# Patient Record
Sex: Female | Born: 1990 | Race: White | Hispanic: No
Health system: Midwestern US, Community
[De-identification: ages and names within clinical notes are randomized; demographics above are authoritative.]

## PROBLEM LIST (undated history)

## (undated) DIAGNOSIS — R52 Pain, unspecified: Secondary | ICD-10-CM

## (undated) DIAGNOSIS — G8918 Other acute postprocedural pain: Secondary | ICD-10-CM

## (undated) DIAGNOSIS — R110 Nausea: Secondary | ICD-10-CM

---

## 2019-05-16 IMAGING — CT CT abd/pelvis STONE PR
2 of 3 series · 14 of 42 positions shown, 18 images · non-contrast
Comparison: None

Procedure(s): CT abd/pelvis STONE PROTOCOL
PROCEDURE:

CT ABDOMEN AND PELVIS WO CONTRAST
HISTORY: Left upper quadrant abdominal pain
TECHNIQUE: CT abdomen and pelvis was performed without intravenous contrast. Axial, coronal, and sagittal
reformatted images generated.
Preliminary report was issued by [HOSPITAL].

[Series 200: ax · axial · 0.70mm/px · z∈[-404,+31]mm · 11 of 166 slices shown, 15 images]
[im 14/166  soft-tissue]
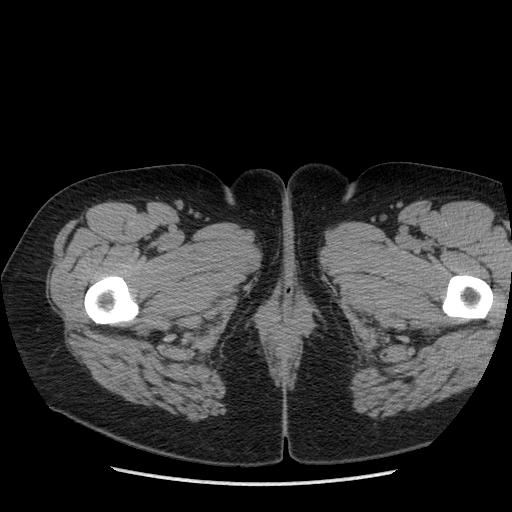
[im 14/166  bone]
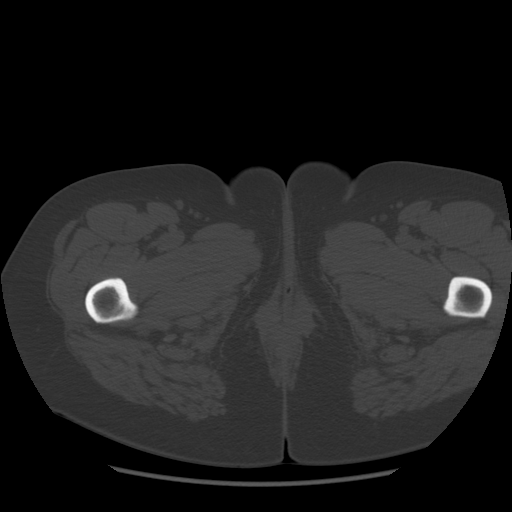
[im 34/166  soft-tissue]
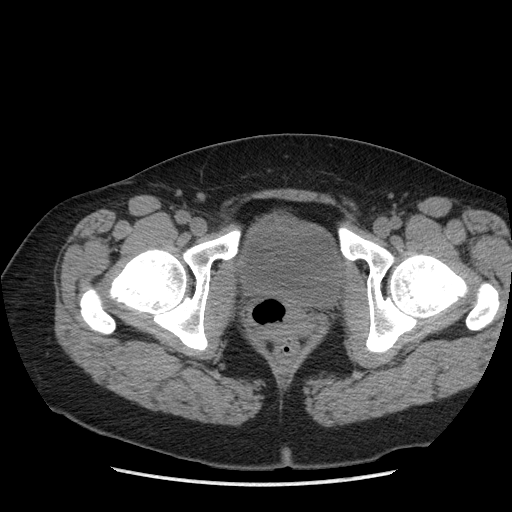
[im 47/166  soft-tissue]
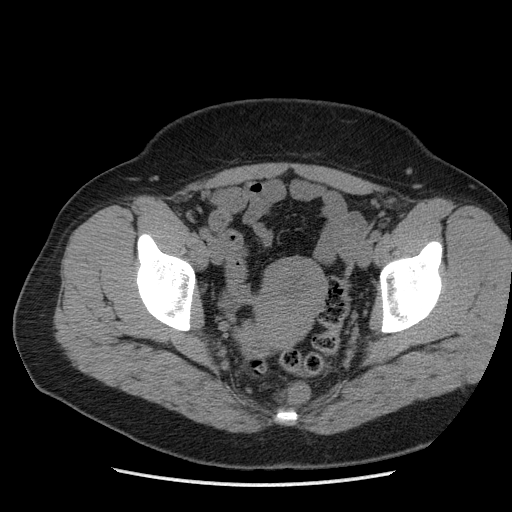
[im 67/166  soft-tissue]
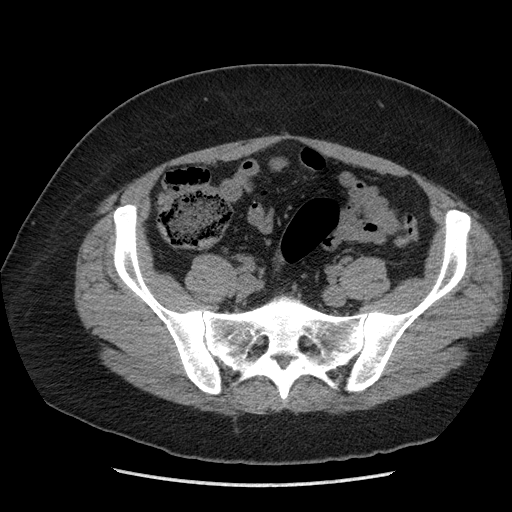
[im 86/166  soft-tissue]
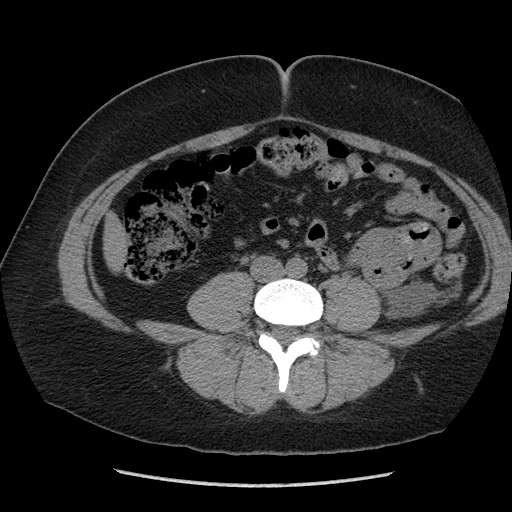
[im 100/166  soft-tissue]
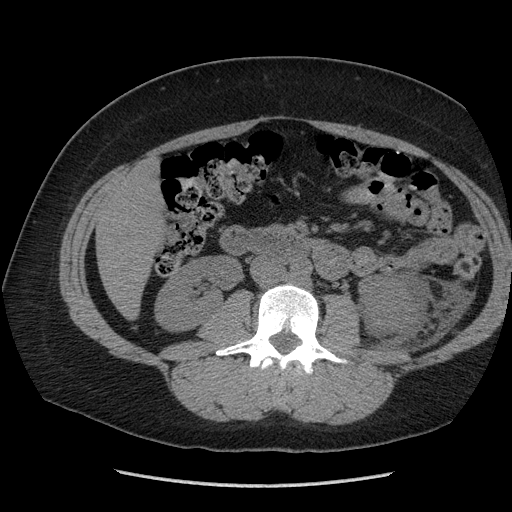
[im 119/166  soft-tissue]
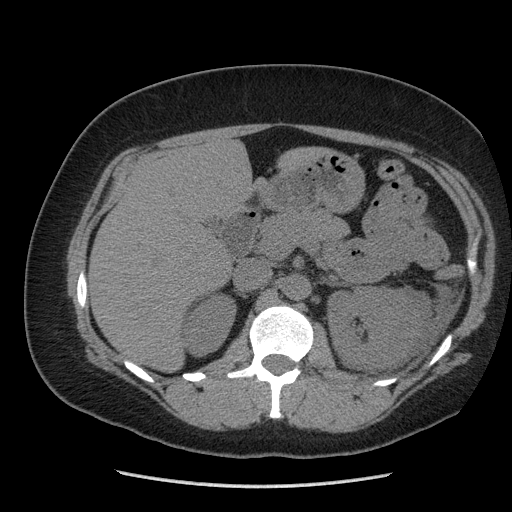
[im 139/166  soft-tissue]
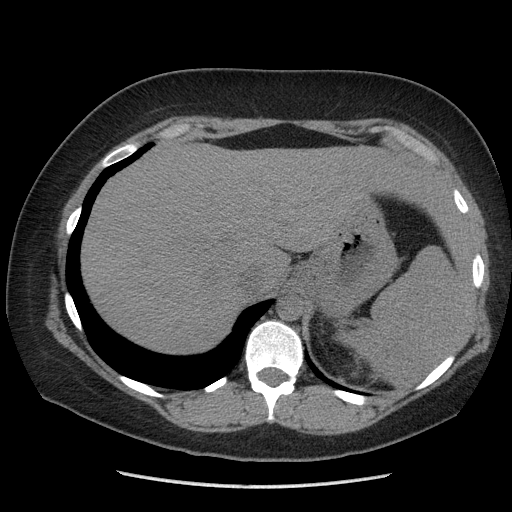
[im 139/166  lung]
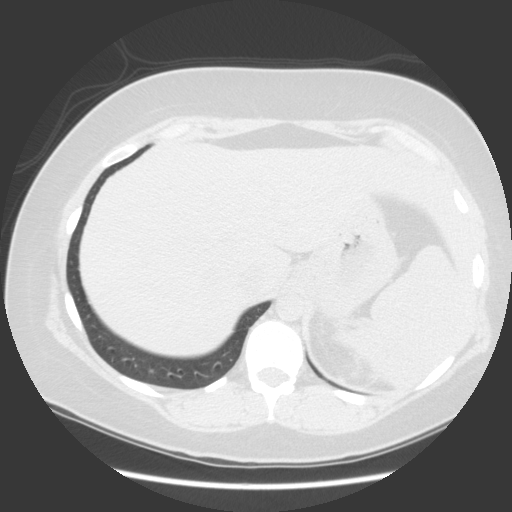
[im 146/166  lung]
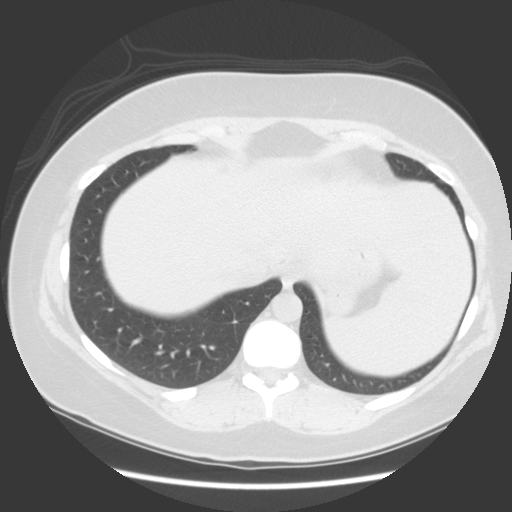
[im 152/166  soft-tissue]
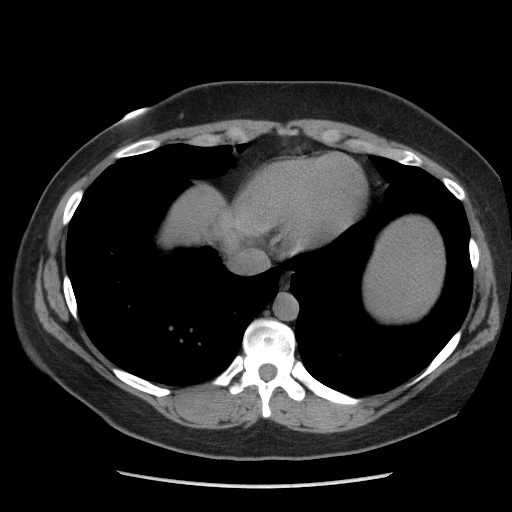
[im 152/166  lung]
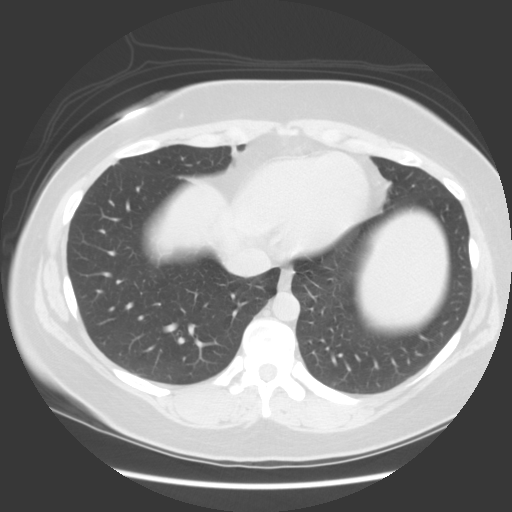
[im 152/166  bone]
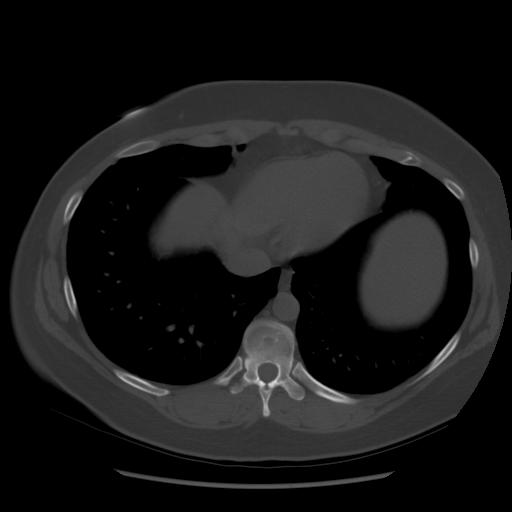
[im 159/166  lung]
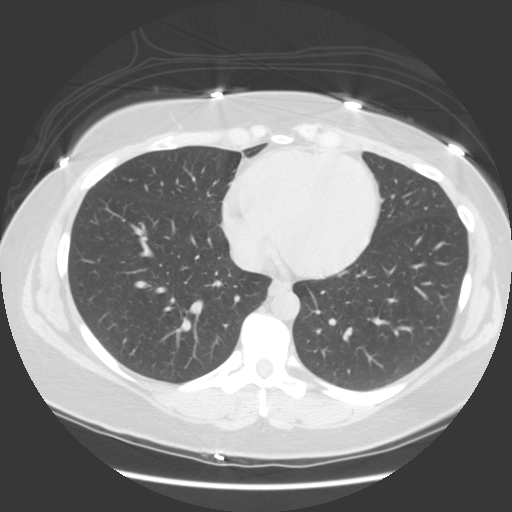

[Series 203: cor · coronal · 0.99mm/px · 3 of 106 slices shown]
[im 36/106  soft-tissue]
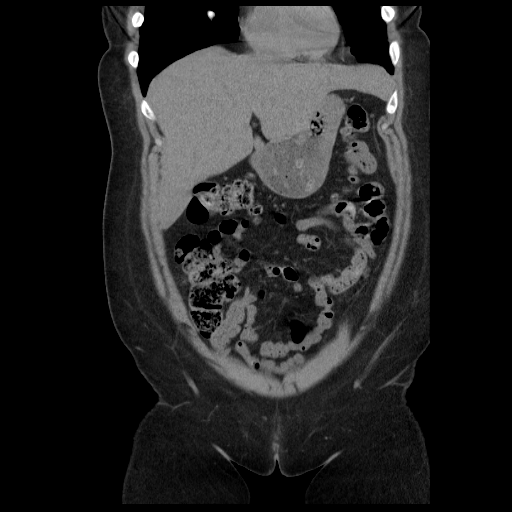
[im 47/106  soft-tissue]
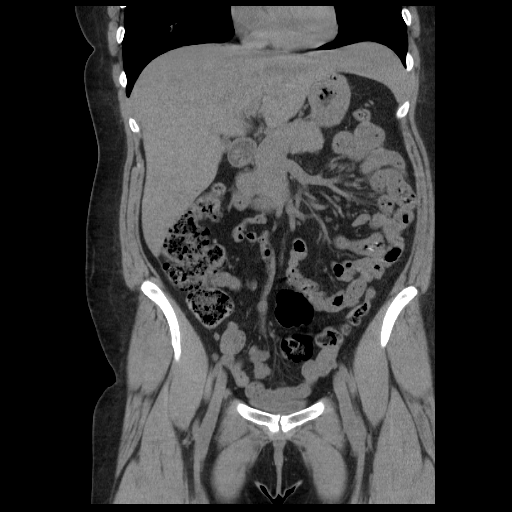
[im 59/106  soft-tissue]
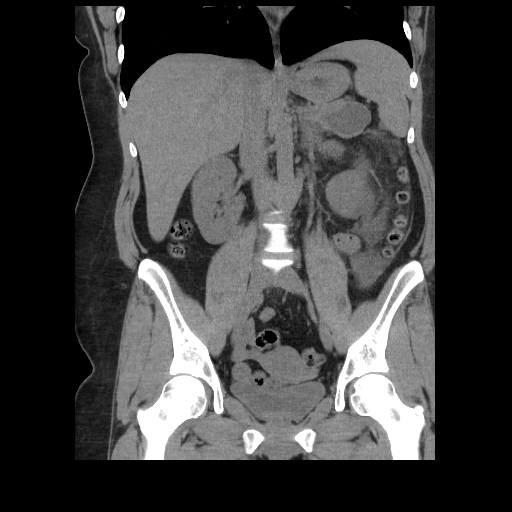

[14 of 42 positions shown; findings below may reference images not displayed]

FINDINGS: ABDOMEN:

Lung Bases: There is calcified granuloma in the medial segment right middle lobe.  Lung bases
otherwise appear clear.

Liver: Normal

Bile ducts: Normal

Gallbladder: Contracted, but otherwise unremarkable.

Pancreas: There is a 5 cm diameter hypodensity in the pancreatic tail, possibly representing
pseudocyst.  Cystic pancreatic neoplasm must also be considered in the differential.  Advise MRI
abdomen without and with IV contrast to further assess.

Spleen: Grossly unremarkable.

Adrenals: There is enlargement of the of the left adrenal gland, possibly from adrenal gland
hyperplasia.  Adrenal gland hemorrhage or mass cannot be entirely excluded.  Follow-up is advised.
The right adrenal gland is unremarkable.

Kidneys: Left kidney is edematous and there are perinephric inflammatory changes.  Pyelonephritis
would have to be a prime consideration.  No evidence for hydronephrosis is seen.  The right kidney
is grossly unremarkable.

Bowel: There is no evidence for bowel obstruction.  No bowel wall thickening is seen.

Mesenteric lymph nodes: There is no evidence for mesenteric adenopathy.

Peritoneum: No free air or free fluid is identified.

Vessels: Grossly unremarkable.

Retroperitoneum: Grossly unremarkable.

Abdominal wall: Grossly unremarkable.

Bones: Skeletal structures are grossly unremarkable.

PELVIS:

Appendix: The appendix appears normal.

Bladder: Grossly unremarkable.

Reproductive system: There is a tampon in place.  Otherwise, unremarkable.

Bones: As above.
IMPRESSION: 1.  Edema of the left kidney with perinephric inflammatory change.  Findings are concerning for
pyelonephritis, though recently passed ureteral calculus would have to be a consideration.  Advise
clinical correlation and follow-up.  Contrasted study/CT IVP would probably be helpful to further
assess, if clinically indicated.

2.  Enlargement of the left adrenal gland with differential diagnosis of hyperplasia, adenoma, or
possibly even adrenal gland hemorrhage.  Interval follow-up would be advised.

3.  Pancreatic tail hypodensity, possible fluid collection.  Differential diagnosis would include
pseudocyst or neoplasm.  Advise MRI of the abdomen without and with IV contrast to further assess.

## 2019-05-18 IMAGING — MR MR abdomen wo/w con
12 of 19 series · 19 of 48 positions shown · IV contrast (agent unspecified)
Comparison: CT from May 16, 2019

Procedure(s): MR abdomen wo/w con
PROCEDURE:

MRI of the abdomen and pelvis without and with IV contrast.
HISTORY: Follow-up abnormal CT.  History of pancreatitis.
TECHNIQUE: Pre- and dynamic post-contrast evaluation of the abdomen and pelvis was performed.
Contrast agent: 15 ml dotorem

[Series 5: ax dual echo · axial · 8.0mm · 0.76mm/px · 1 of 56 slices shown]
[im 1/56]
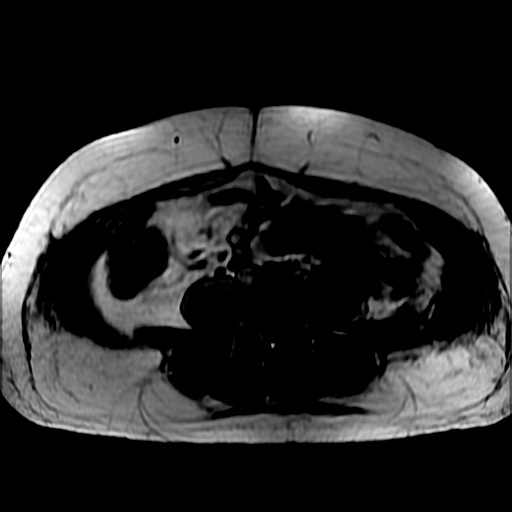

[Series 7: T2 · axial · 8.0mm · 0.78mm/px · 1 of 30 slices shown (1 of 3)]
[im 1/30]
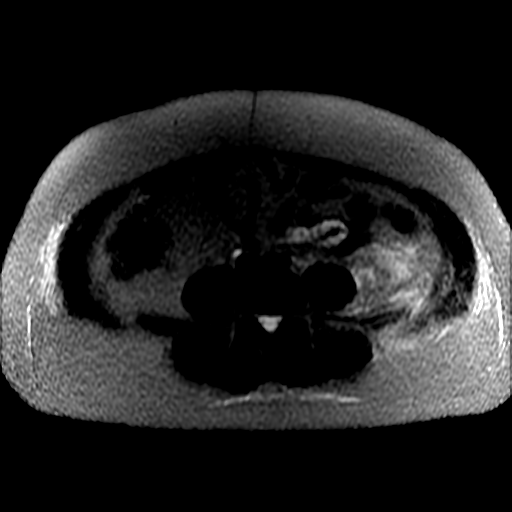

[Series 8: T1 fat-sat · axial · 8.0mm · 0.78mm/px · 1 of 30 slices shown (1 of 3)]
[im 1/30]
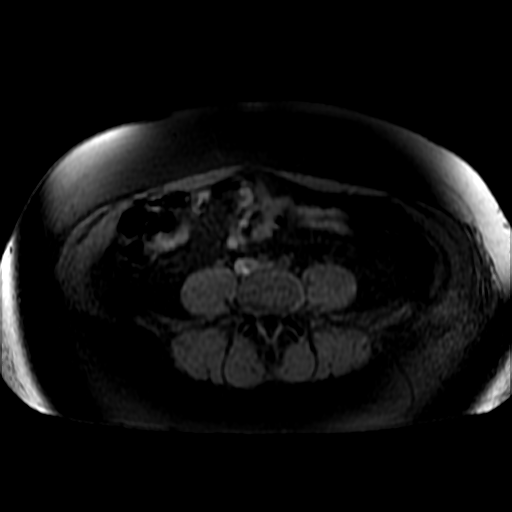

[Series 9: T2 · sagittal · 40.0mm · 0.55mm/px · 1 of 12 slices shown (2 of 3)]
[im 1/12]
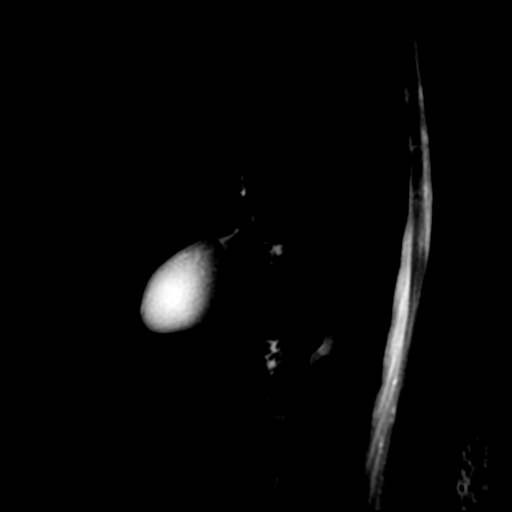

[Series 10: STIR · axial · 8.0mm · 1.56mm/px · 1 of 30 slices shown]
[im 1/30]
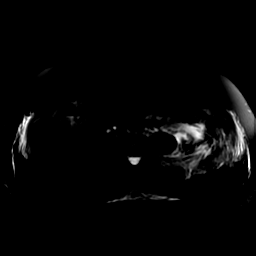

[Series 12: T2 · coronal · 7.0mm · 0.78mm/px · 1 of 26 slices shown (3 of 3)]
[im 1/26]
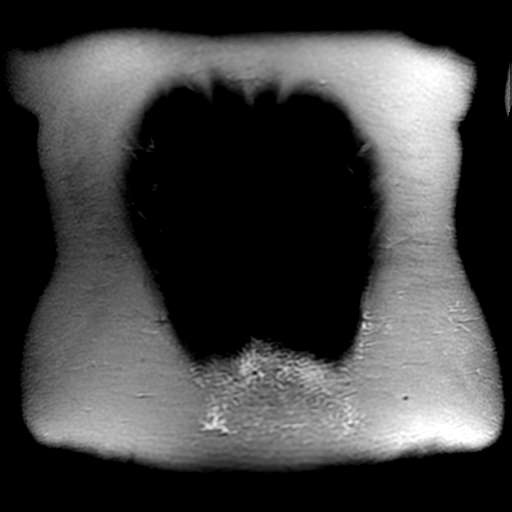

[Series 14: T1 fat-sat · axial · 8.0mm · 0.78mm/px · 1 of 30 slices shown (2 of 3)]
[im 1/30]
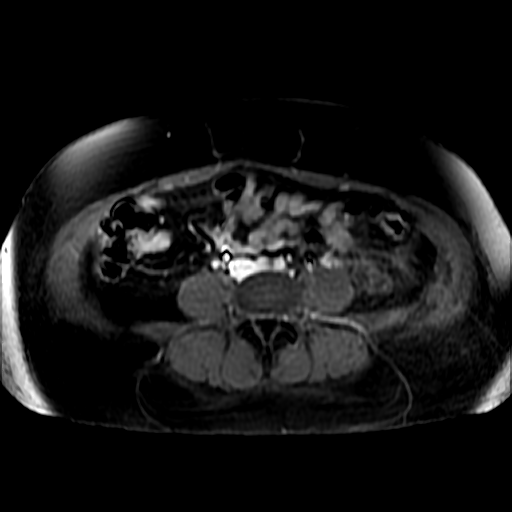

[Series 15: T1 fat-sat · coronal · 7.0mm · 0.78mm/px · 1 of 26 slices shown (3 of 3)]
[im 1/26]
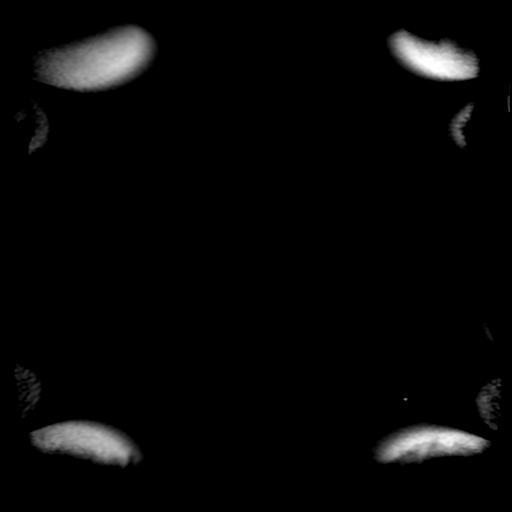

[Series 1301: T1 dynamic · axial · 4.8mm · 0.78mm/px · z∈[-131,+97]mm · 3 of 96 slices shown (1 of 3)]
[im 1/96]
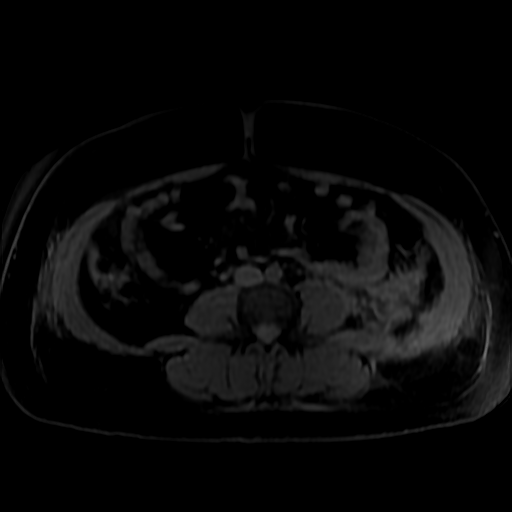
[im 48/96]
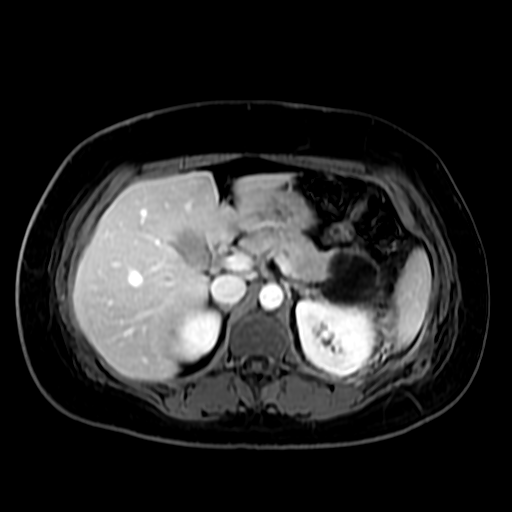
[im 96/96]
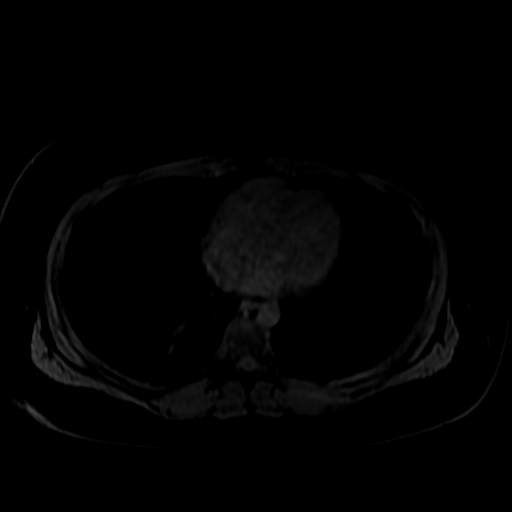

[Series 1302: T1 dynamic · axial · 4.8mm · 0.78mm/px · z∈[-131,+97]mm · 3 of 96 slices shown (2 of 3)]
[im 1/96]
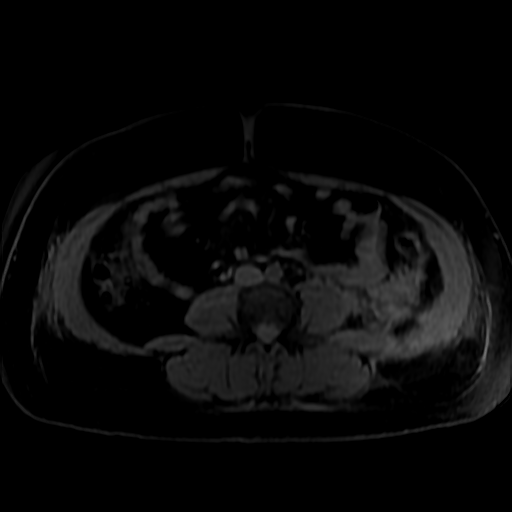
[im 48/96]
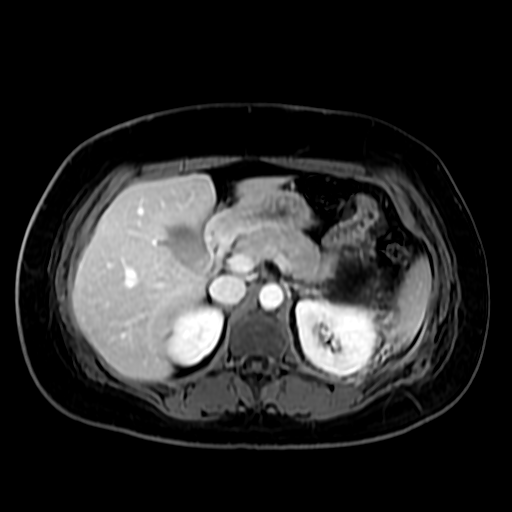
[im 96/96]
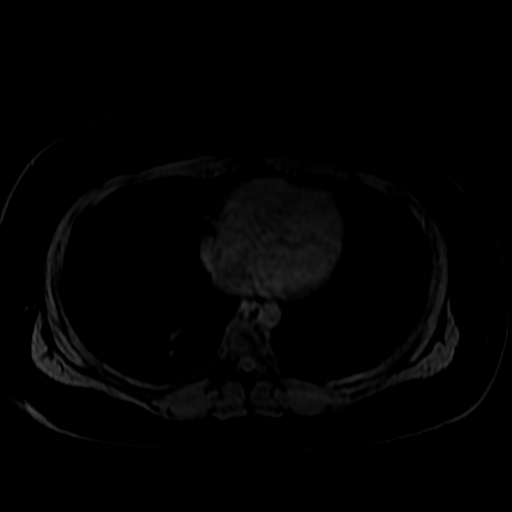

[Series 1303: T1 dynamic · axial · 4.8mm · 0.78mm/px · z∈[-131,+97]mm · 4 of 96 slices shown (3 of 3)]
[im 1/96]
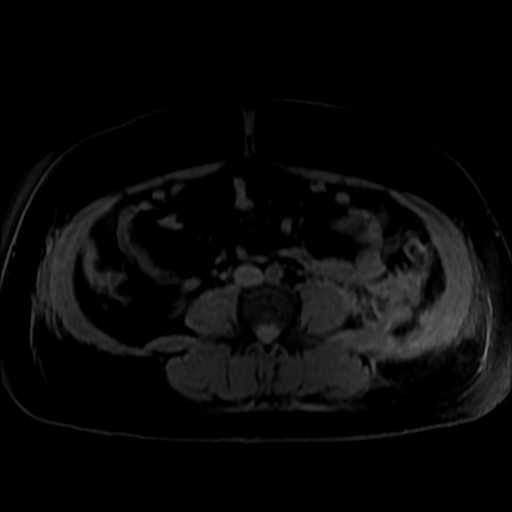
[im 32/96]
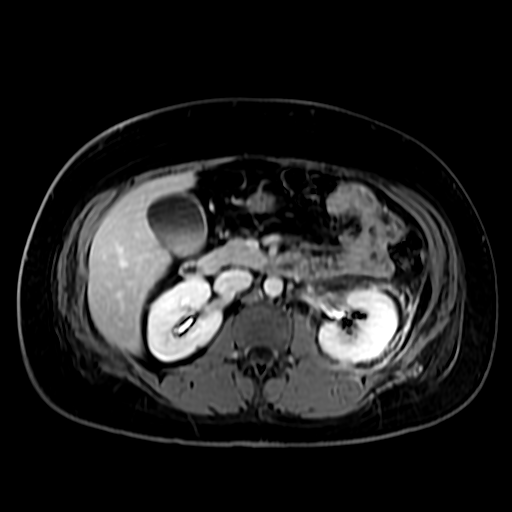
[im 64/96]
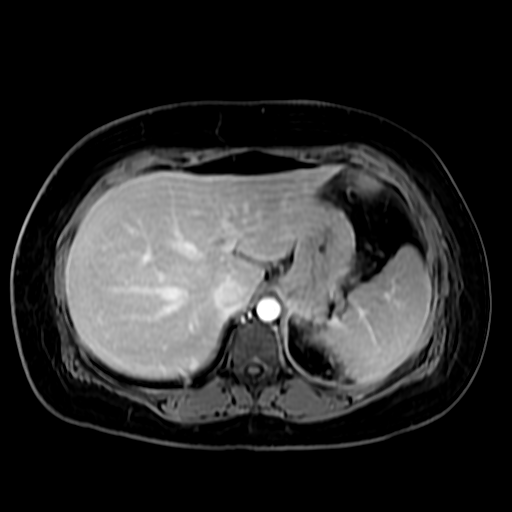
[im 96/96]
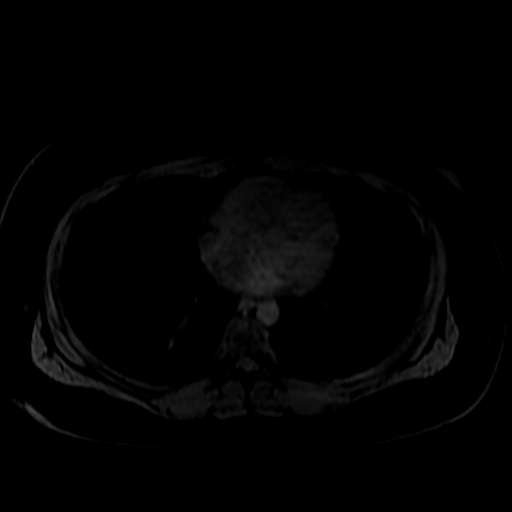

[((date))-((date)) · axial · 4.8mm · 0.78mm/px · 1 of 96 slices shown]
[im 1/96]
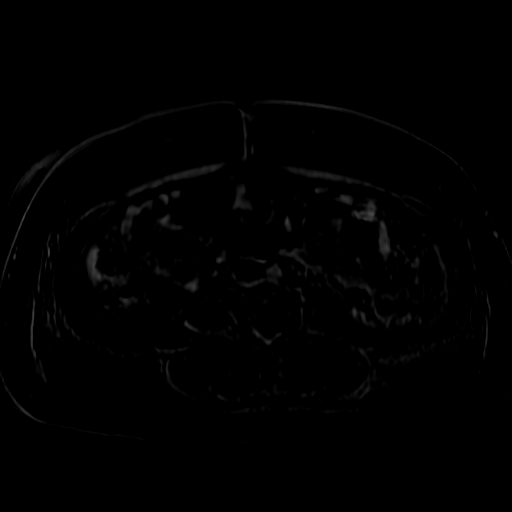

[19 of 48 positions shown; findings below may reference images not displayed]

FINDINGS: Pancreas and biliary tree

Pancreas: In the tail of pancreas there is a well-circumscribed T2 hyperintense and T1 hypointense
lesion containing a few internal septations.  There is no visualized solid components or abnormal
postcontrast enhancement.  No evidence of internal debris.  The lesion measures 5.0 x 5.4 x 4.3 cm.
Normally enhancing pancreatic parenchyma.  No main pancreatic duct dilation.

Bile ducts: Bile ducts are nondilated.

Other
FINDINGS: Liver: Hepatic steatosis.  No focal hepatic lesions.  No intrahepatic biliary ductal dilation.

Spleen: Normal

Gallbladder: Normal

Lymph nodes: No adenopathy.

Kidneys: Normal enhancing renal parenchyma.  No collecting system dilation.  There is increased
fluid signal within the left perinephric fascia.

Adrenals: Normal.

Bowel: Normal

Skeletal: Normal

Lung bases: Normal

Pelvis:

Bladder: Normal

Reproductive organs: Normal

Lymph nodes: Normal

Additional findings: There is fluid tracking down the left retroperitoneum that is contiguous with
the fluid seen in the left perinephric fascia.  Simple appearing fluid within the dependent portion
of the pelvis.
IMPRESSION: 1.  Cystic lesion in the tail of the pancreas without evidence of solid components or postcontrast
enhancement.  Differential favors pseudocyst given history of pancreatitis versus mucinous cystic
neoplasm.  Surgical oncology consultation could be considered in addition to recommend follow-up MRI
in 6 months to document stability.

2.  Edema within the left perinephric fascia that tracks inferiorly into the pelvis with a small
amount of dependent free fluid in the pelvis.  Correlate for history of recent urinary stone passage
or recent history of pyelonephritis.

3.  Hepatic steatosis.

## 2019-05-18 IMAGING — MR MR pelvis wo/w con
4 of 8 series · 23 of 48 positions shown · IV contrast (15 DOTAREM)
Comparison: CT from May 16, 2019

Procedure(s): MR pelvis wo/w con
PROCEDURE:

MRI of the abdomen and pelvis without and with IV contrast.
HISTORY: Follow-up abnormal CT.  History of pancreatitis.
TECHNIQUE: Pre- and dynamic post-contrast evaluation of the abdomen and pelvis was performed.
Contrast agent: 15 ml dotorem

[Series 2: STIR · coronal · 5.0mm · 1.56mm/px · 6 of 34 slices shown]
[im 1/34]
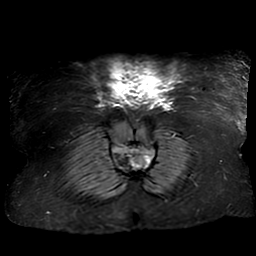
[im 7/34]
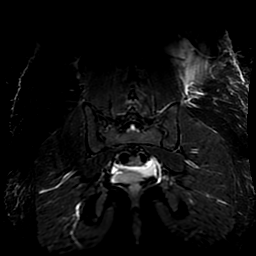
[im 14/34]
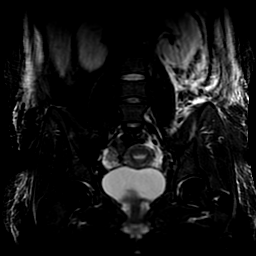
[im 20/34]
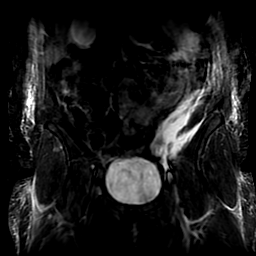
[im 27/34]
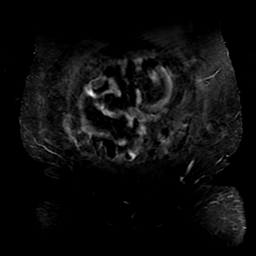
[im 34/34]
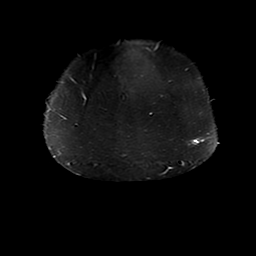

[Series 3: T1 fat-sat · coronal · 5.0mm · 0.78mm/px · 6 of 34 slices shown]
[im 1/34]
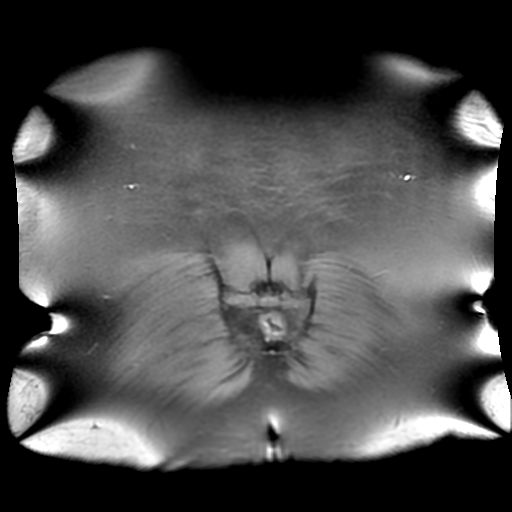
[im 7/34]
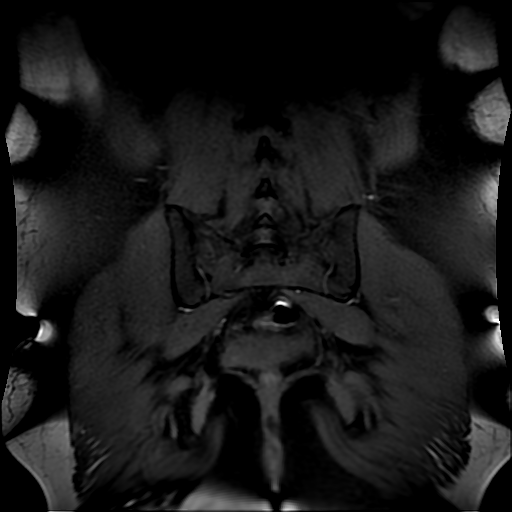
[im 14/34]
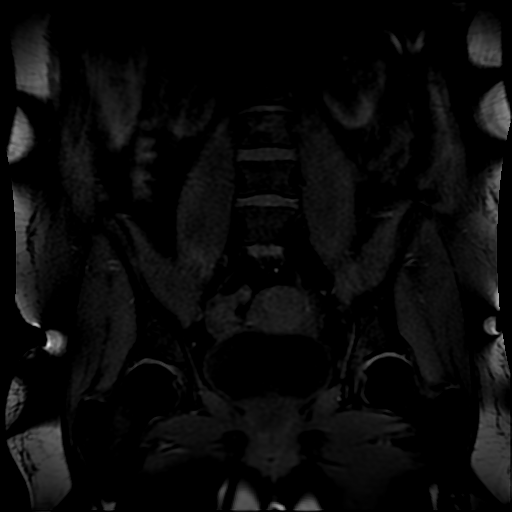
[im 20/34]
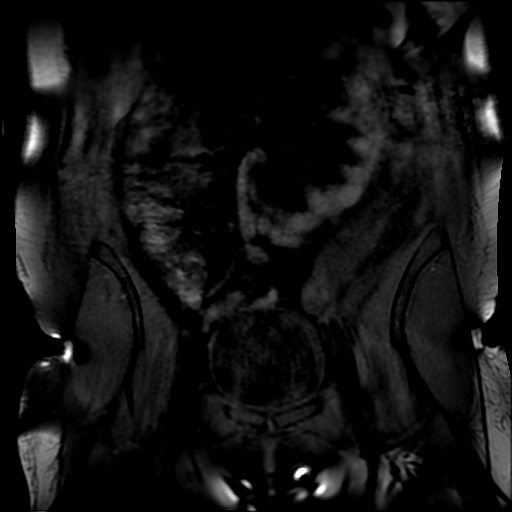
[im 27/34]
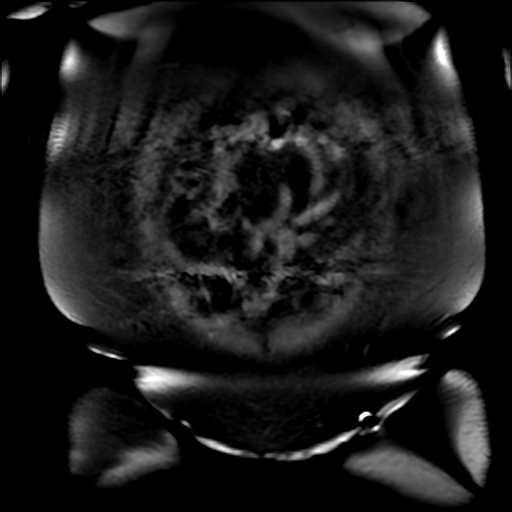
[im 34/34]
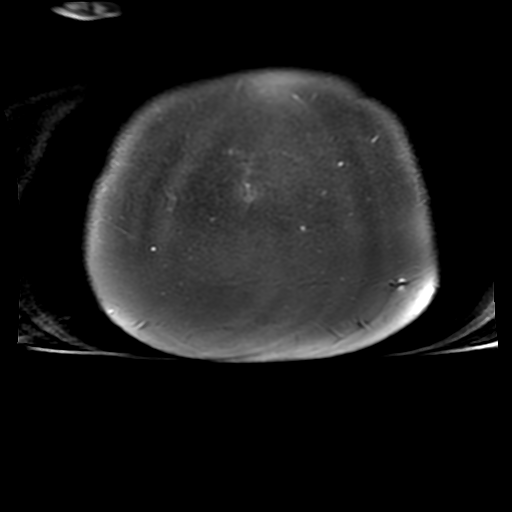

[Series 3: T1 fat-sat post-contrast · axial · 5.0mm · 0.74mm/px · z∈[-357,-147]mm · 6 of 36 slices shown (1 of 2)]
[im 1/36]
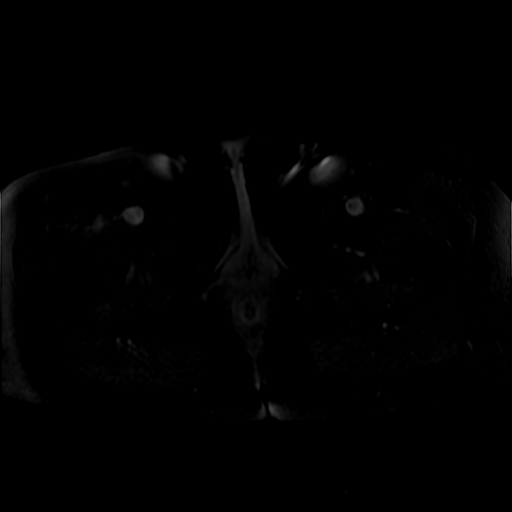
[im 8/36]
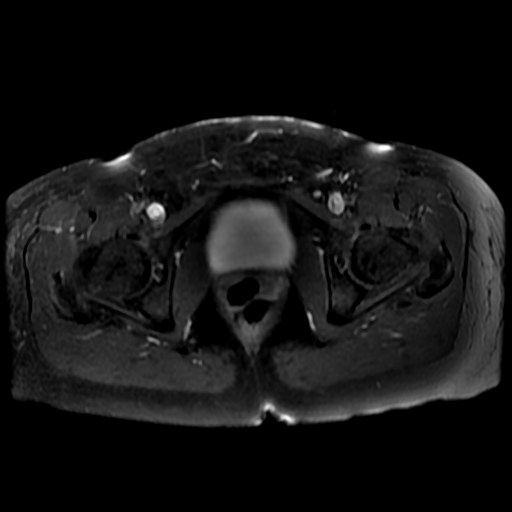
[im 15/36]
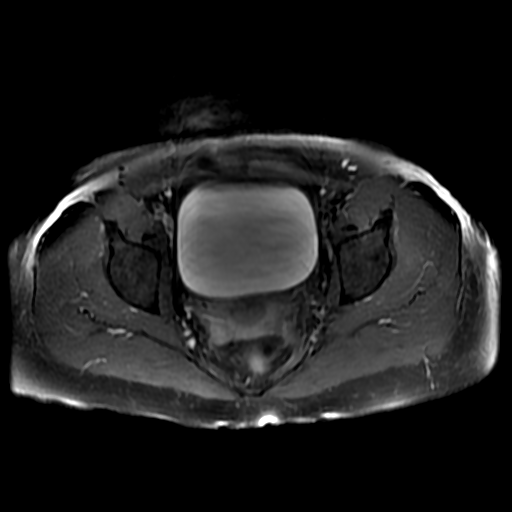
[im 22/36]
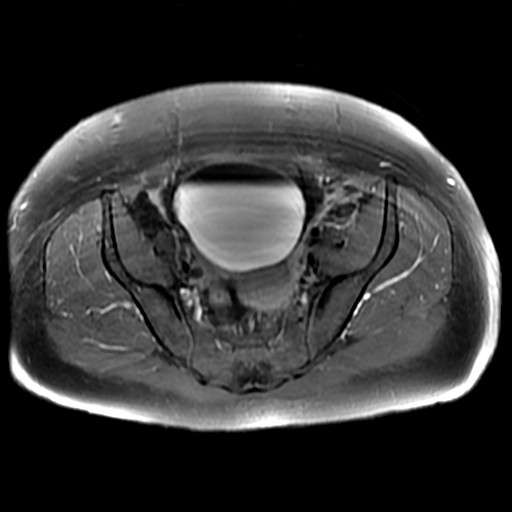
[im 29/36]
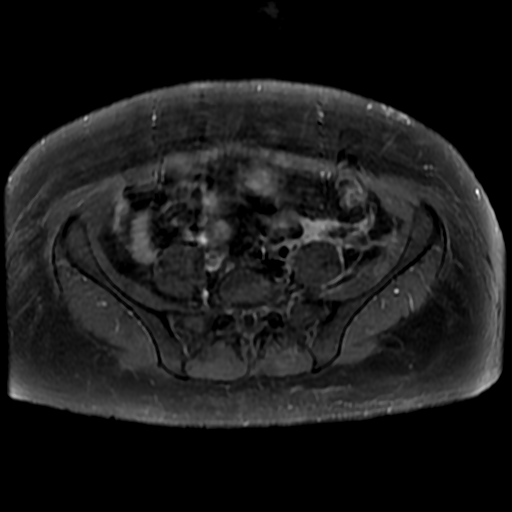
[im 36/36]
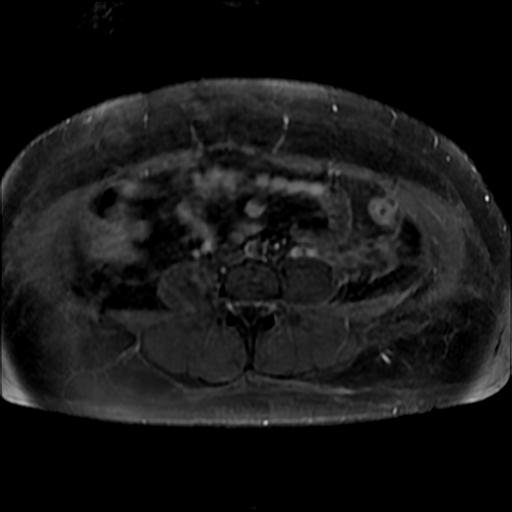

[Series 4: T1 fat-sat post-contrast · coronal · 5.0mm · 0.78mm/px · 5 of 34 slices shown (2 of 2)]
[im 1/34]
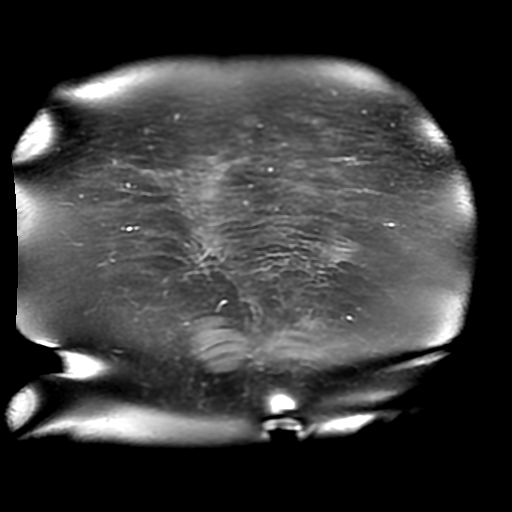
[im 7/34]
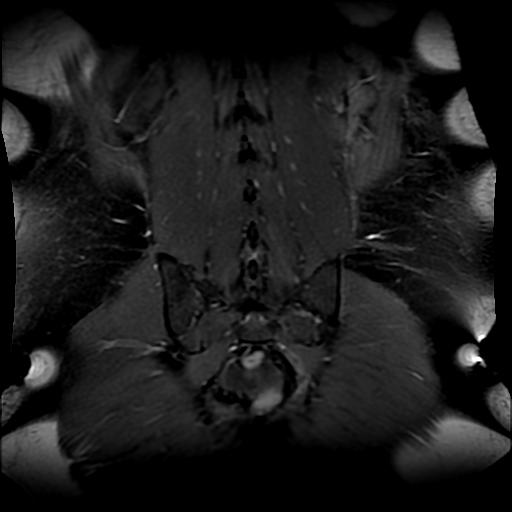
[im 14/34]
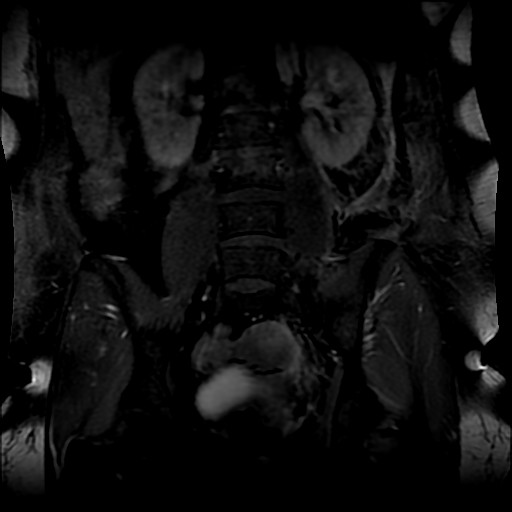
[im 20/34]
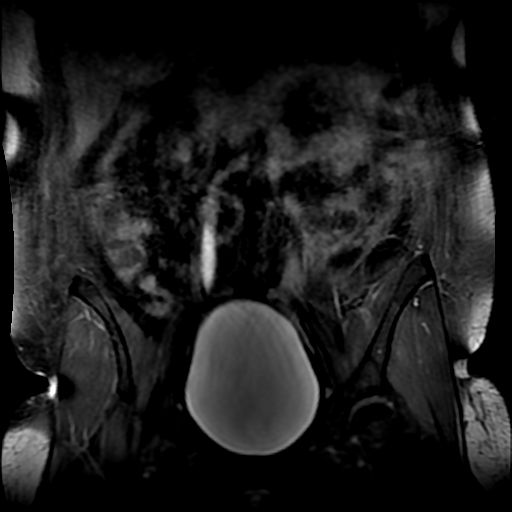
[im 34/34]
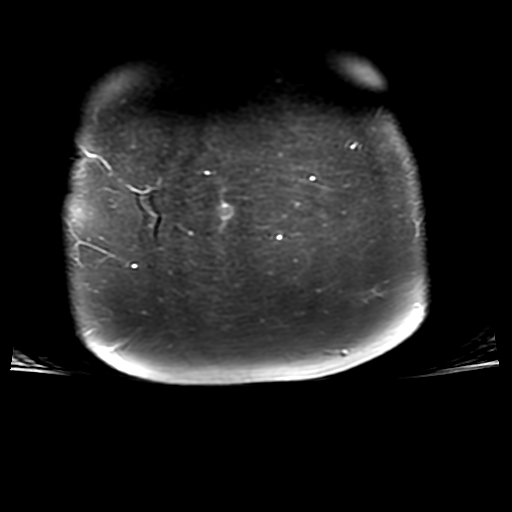

[23 of 48 positions shown; findings below may reference images not displayed]

FINDINGS: Pancreas and biliary tree

Pancreas: In the tail of pancreas there is a well-circumscribed T2 hyperintense and T1 hypointense
lesion containing a few internal septations.  There is no visualized solid components or abnormal
postcontrast enhancement.  No evidence of internal debris.  The lesion measures 5.0 x 5.4 x 4.3 cm.
Normally enhancing pancreatic parenchyma.  No main pancreatic duct dilation.

Bile ducts: Bile ducts are nondilated.

Other
FINDINGS: Liver: Hepatic steatosis.  No focal hepatic lesions.  No intrahepatic biliary ductal dilation.

Spleen: Normal

Gallbladder: Normal

Lymph nodes: No adenopathy.

Kidneys: Normal enhancing renal parenchyma.  No collecting system dilation.  There is increased
fluid signal within the left perinephric fascia.

Adrenals: Normal.

Bowel: Normal

Skeletal: Normal

Lung bases: Normal

Pelvis:

Bladder: Normal

Reproductive organs: Normal

Lymph nodes: Normal

Additional findings: There is fluid tracking down the left retroperitoneum that is contiguous with
the fluid seen in the left perinephric fascia.  Simple appearing fluid within the dependent portion
of the pelvis.
IMPRESSION: 1.  Cystic lesion in the tail of the pancreas without evidence of solid components or postcontrast
enhancement.  Differential favors pseudocyst given history of pancreatitis versus mucinous cystic
neoplasm.  Surgical oncology consultation could be considered in addition to recommend follow-up MRI
in 6 months to document stability.

2.  Edema within the left perinephric fascia that tracks inferiorly into the pelvis with a small
amount of dependent free fluid in the pelvis.  Correlate for history of recent urinary stone passage
or recent history of pyelonephritis.

3.  Hepatic steatosis.

## 2019-06-25 IMAGING — CT CT abd/pelvis W/ IV only
2 of 4 series · 15 of 46 positions shown, 17 images · IV contrast (CE)
Comparison: MRI from May 18, 2019 and CT from May 16, 2019

Procedure(s): CT abd/pelvis W/ IV only
PROCEDURE:

CT ABDOMEN AND PELVIS W CONTRAST
HISTORY: Pancreatitis.
TECHNIQUE: CT abdomen and pelvis was performed with intravenous contrast. Axial, coronal, and sagittal
reformatted images generated. 100 ml Omnipaque 350 injected IV without complications.

[Series 2: soft tissue body 5.0 ce · axial · 0.78mm/px · z∈[+1276,+1721]mm · 12 of 99 slices shown, 14 images]
[im 5/99  soft-tissue]
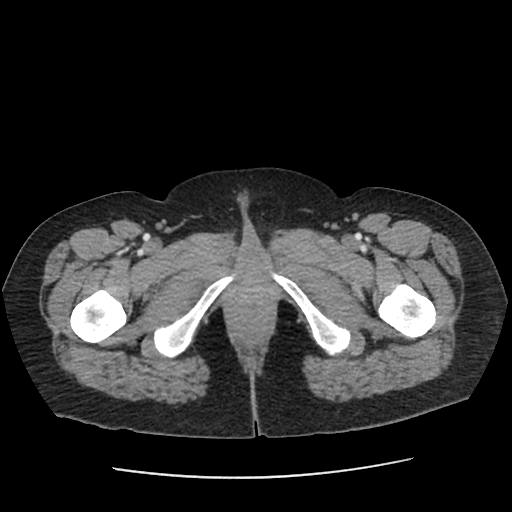
[im 5/99  bone]
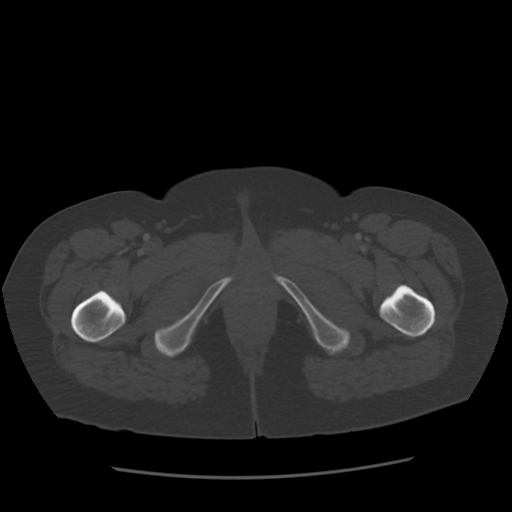
[im 13/99  soft-tissue]
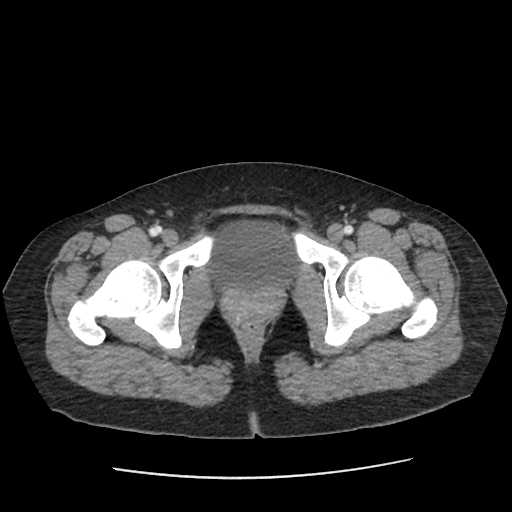
[im 21/99  soft-tissue]
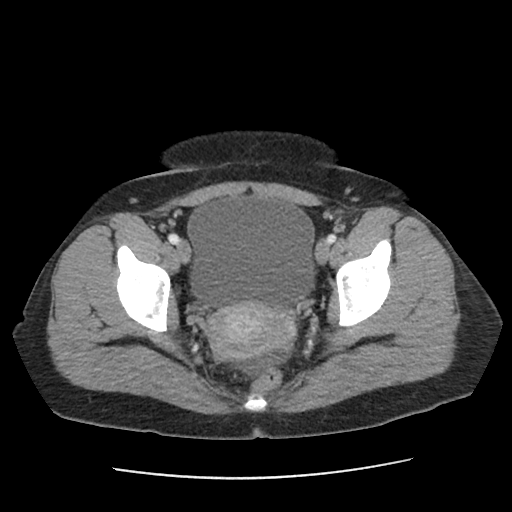
[im 29/99  soft-tissue]
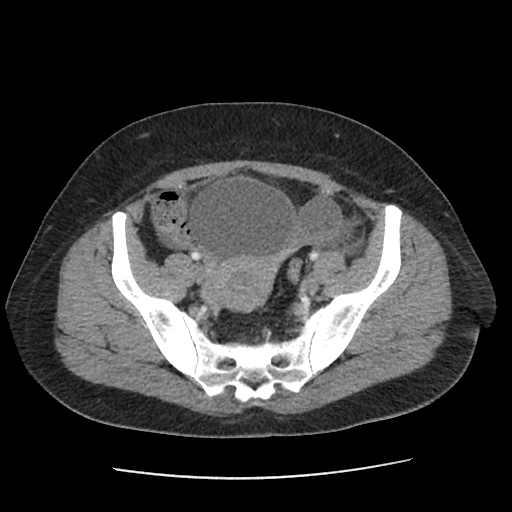
[im 37/99  soft-tissue]
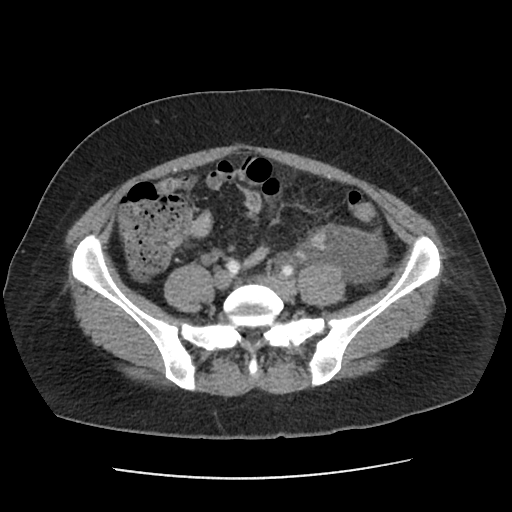
[im 45/99  soft-tissue]
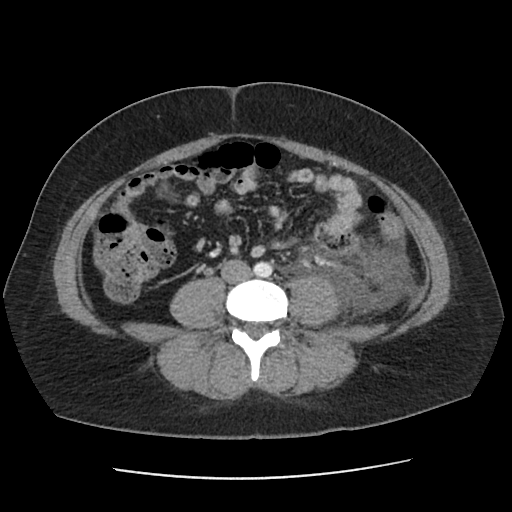
[im 54/99  soft-tissue]
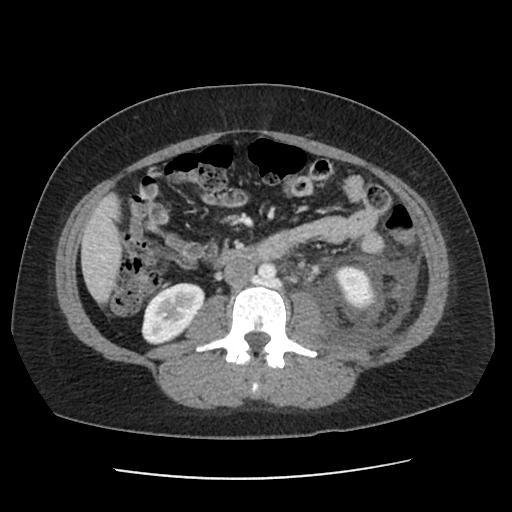
[im 62/99  soft-tissue]
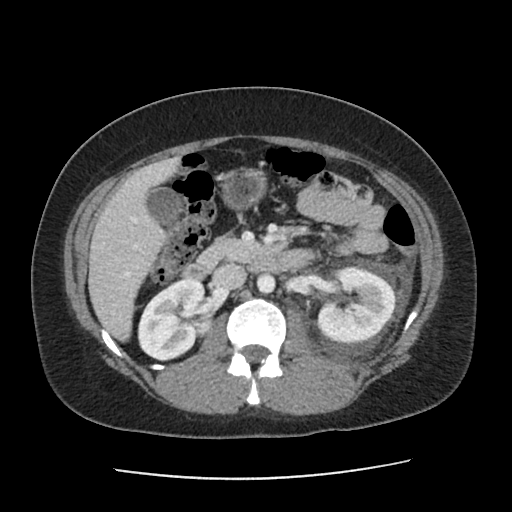
[im 70/99  soft-tissue]
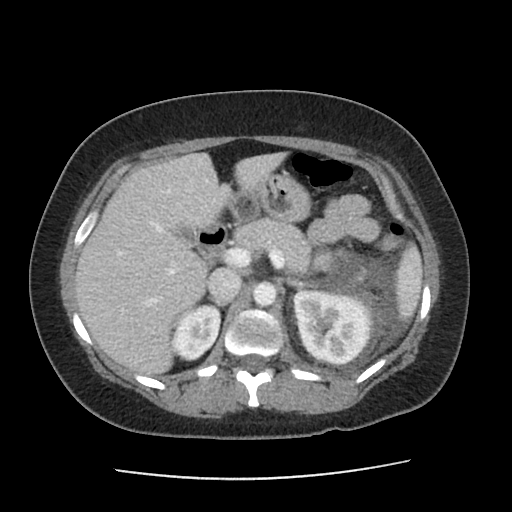
[im 70/99  bone]
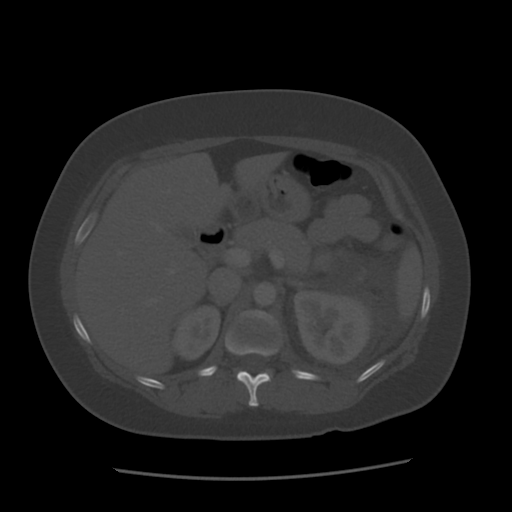
[im 78/99  soft-tissue]
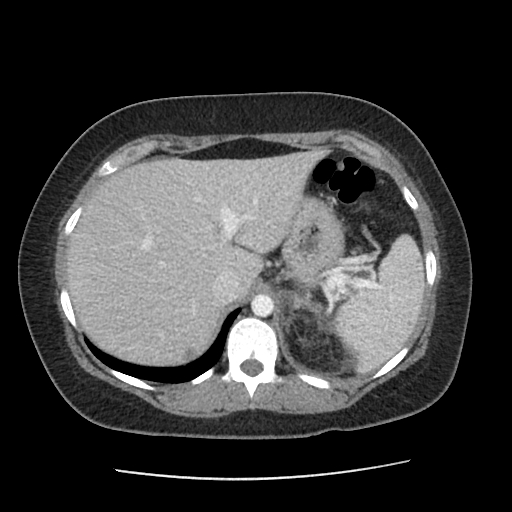
[im 86/99  soft-tissue]
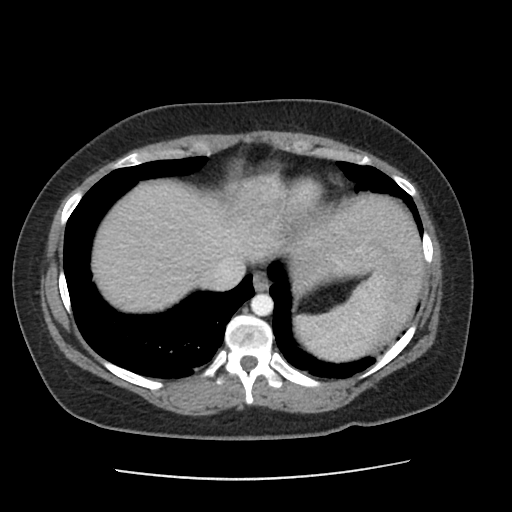
[im 94/99  soft-tissue]
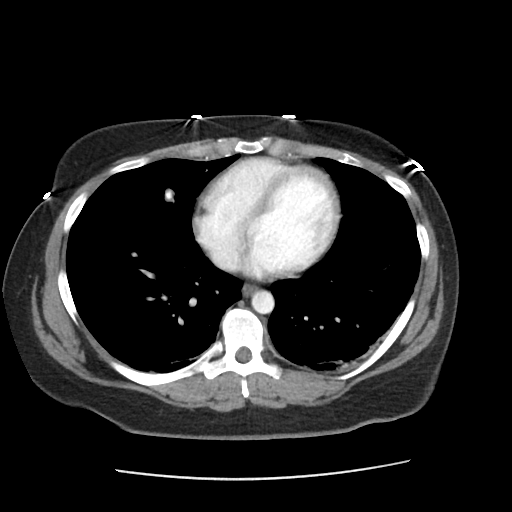

[Series 5: body 5.000 ce · coronal · 0.78mm/px · 3 of 59 slices shown]
[im 20/59  soft-tissue]
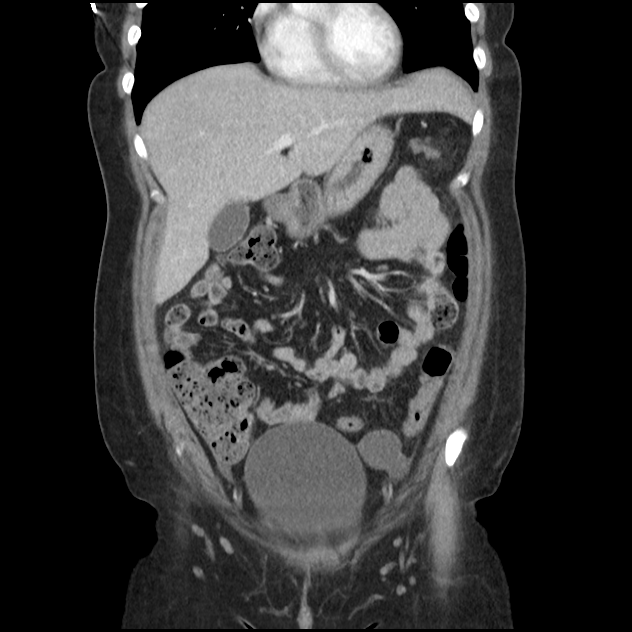
[im 26/59  soft-tissue]
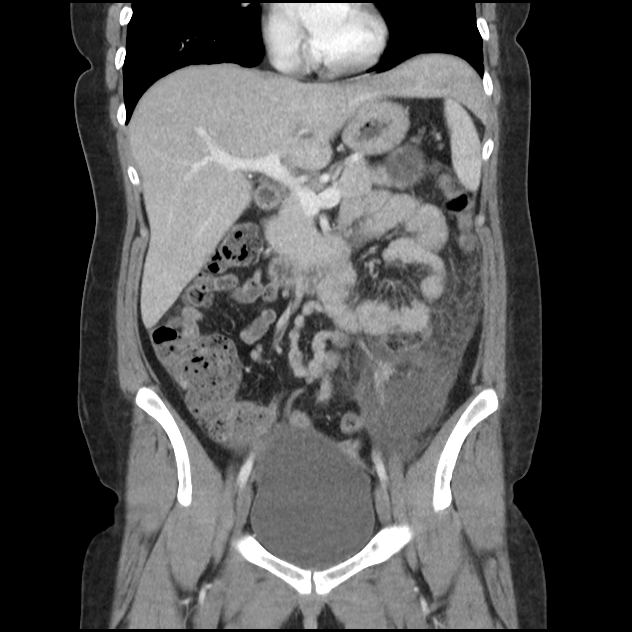
[im 33/59  soft-tissue]
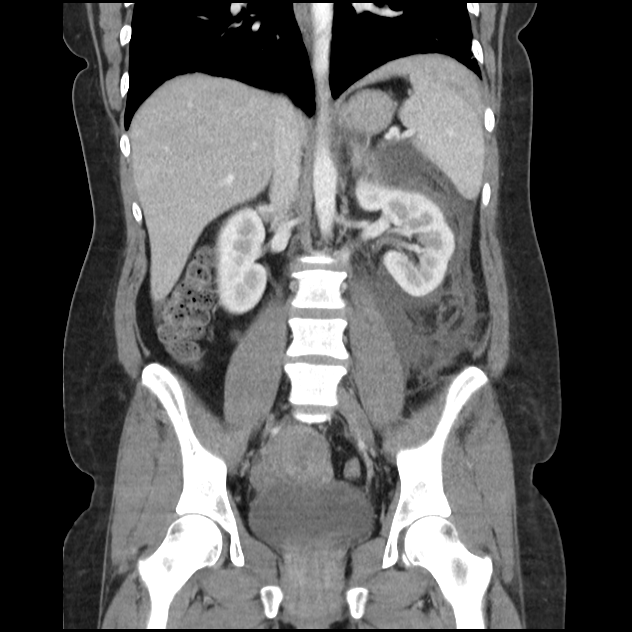

[15 of 46 positions shown; findings below may reference images not displayed]

FINDINGS: ABDOMEN:

Lung Bases: Dependent atelectasis in the lung bases.  Similar partially calcified pulmonary nodule
in the right middle lobe.

Liver and bile ducts : Focal fatty infiltration of the falciform insertion, otherwise the liver is
unremarkable.  No intrahepatic biliary ductal dilation.

Gallbladder: Normal

Pancreas: Redemonstration of a well-circumscribed hypodense lesion within the pancreatic tail,
x 5.2 cm.  Normally enhancing pancreatic parenchyma.  No main pancreatic duct dilation.

Spleen: Normal

Adrenals: Normal

Kidneys and Ureters: Normal

Bowel and Appendix: Bowel caliber is normal.  No abnormal bowel wall thickening.  The appendix is
not visualized.

Mesenteric lymph nodes: No enlarged mesenteric lymph nodes.

Vessels: The aorta is normal in caliber.  Retroaortic right renal vein.

Retroperitoneum: Redemonstration of fluid/inflammatory changes within the left retroperitoneum
which tracks down the left paracolic gutter.

Abdominal wall: No acute findings.

Skeletal: No acute osseous findings.

PELVIS:

Bladder: Normal.

Reproductive system: 3.2 x 3.0 left ovarian cyst appears to be slightly increased in size when
compared to May 16, 2019.

Lymph nodes:No pelvic lymphadenopathy.
IMPRESSION: 1.  Redemonstration of a pseudocyst within the pancreatic tail as previously described on MRI.

2.  Increasing inflammatory changes and fluid within the left retroperitoneum and left paracolic
gutter.  This most likely represents leakage from the pseudocyst.  Surgical consultation is
recommended.

3.  Left ovarian cyst.  Consider ultrasound for further characterization.

## 2019-06-28 IMAGING — CT CT abd/pel ORAL only
2 of 4 series · 15 of 46 positions shown, 17 images · non-contrast
Comparison: None

Procedure(s): CT abd/pel ORAL only
PROCEDURE:

CT ABDOMEN AND PELVIS WO CONTRAST
HISTORY: Postop fever, no pseudocyst.
TECHNIQUE: CT abdomen and pelvis was performed without intravenous contrast. Axial, coronal, and sagittal
reformatted images generated.

[Series 2: soft tissue body 5.0 · axial · 0.84mm/px · z∈[+1080,+1556]mm · 12 of 105 slices shown, 14 images]
[im 5/105  soft-tissue]
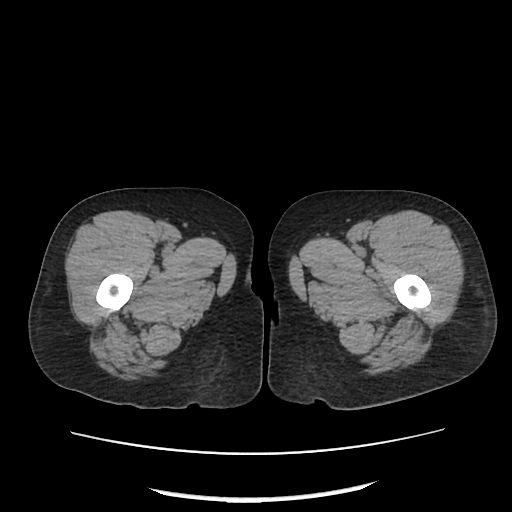
[im 5/105  bone]
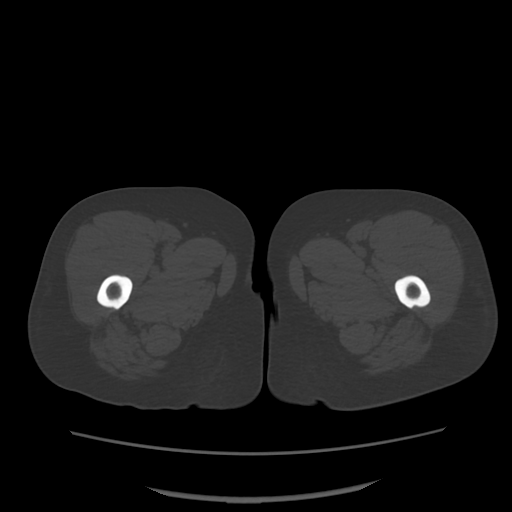
[im 15/105  soft-tissue]
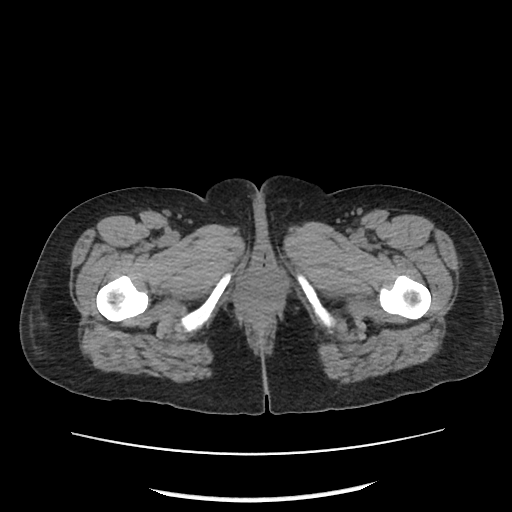
[im 24/105  soft-tissue]
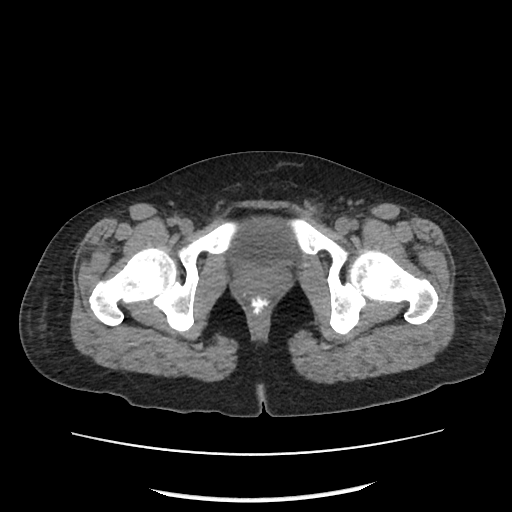
[im 34/105  soft-tissue]
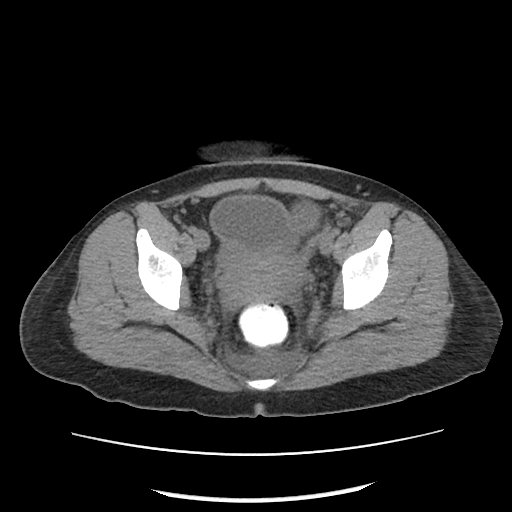
[im 38/105  soft-tissue]
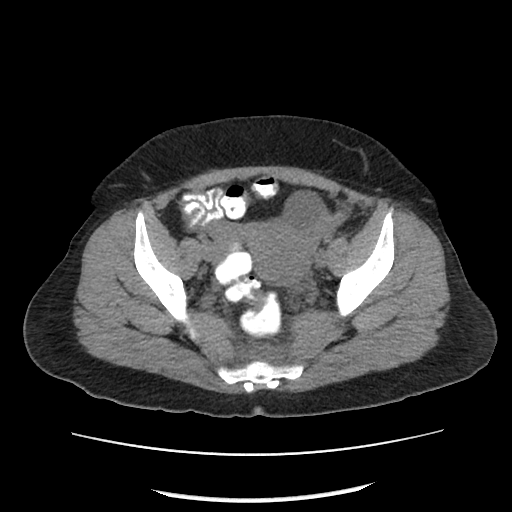
[im 48/105  soft-tissue]
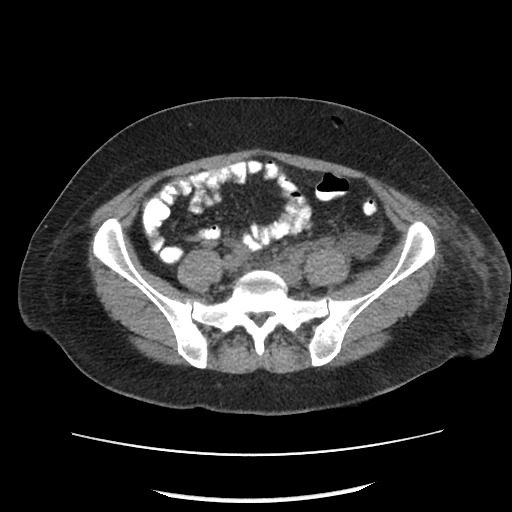
[im 57/105  soft-tissue]
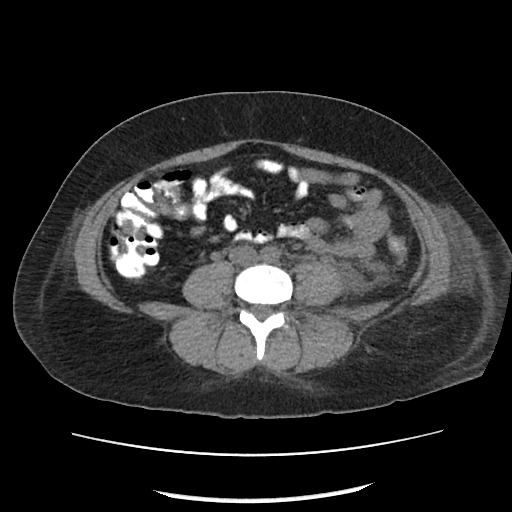
[im 67/105  soft-tissue]
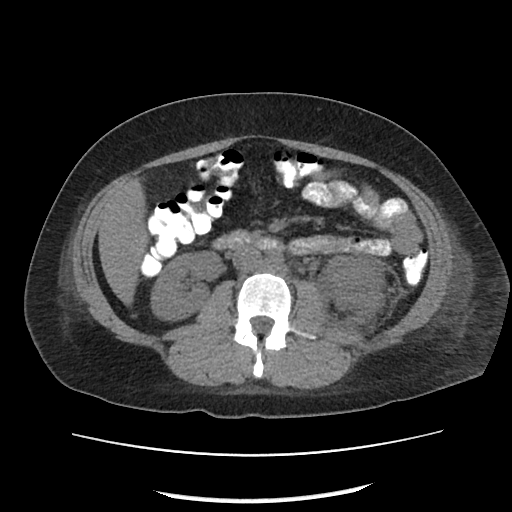
[im 71/105  soft-tissue]
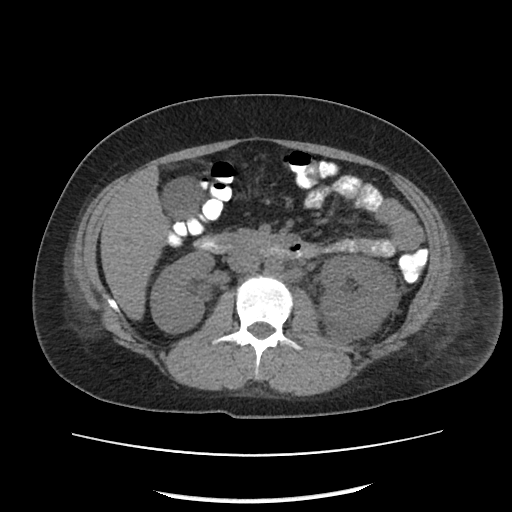
[im 71/105  bone]
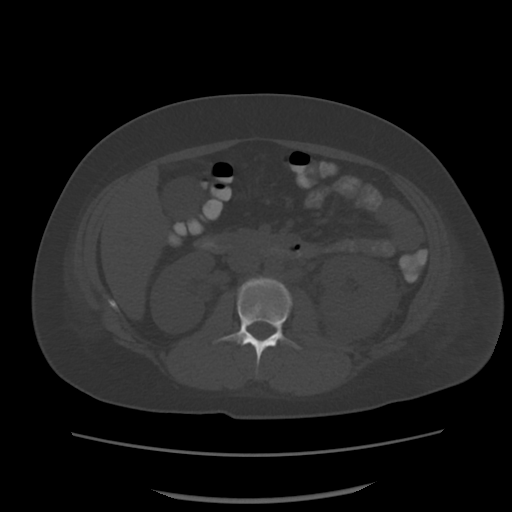
[im 81/105  soft-tissue]
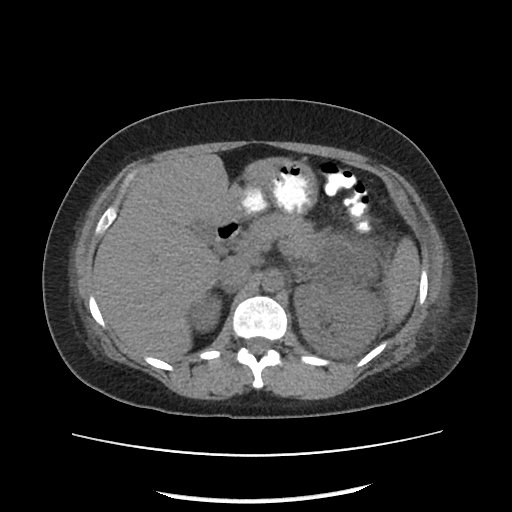
[im 90/105  soft-tissue]
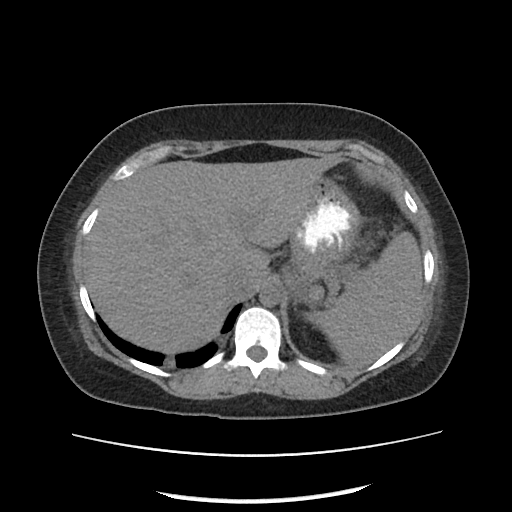
[im 100/105  soft-tissue]
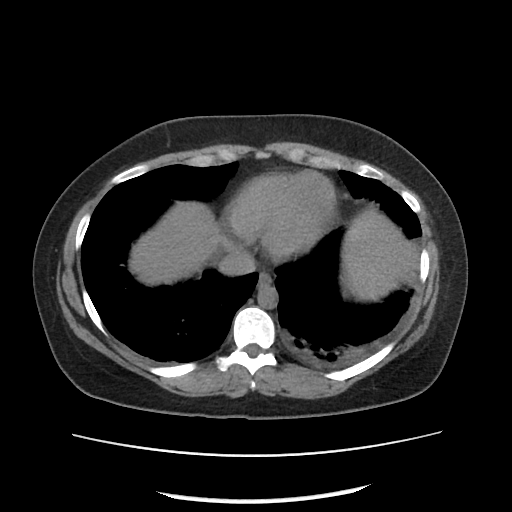

[Series 5: body 5.000 · coronal · 0.84mm/px · 3 of 63 slices shown]
[im 21/63  soft-tissue]
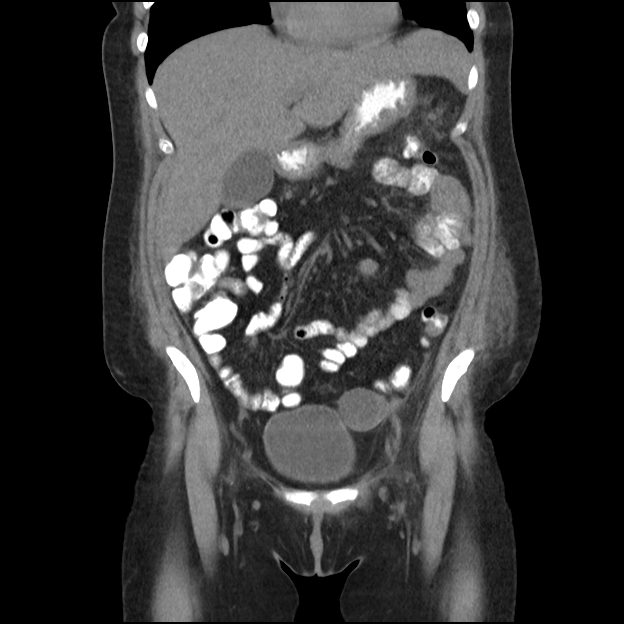
[im 28/63  soft-tissue]
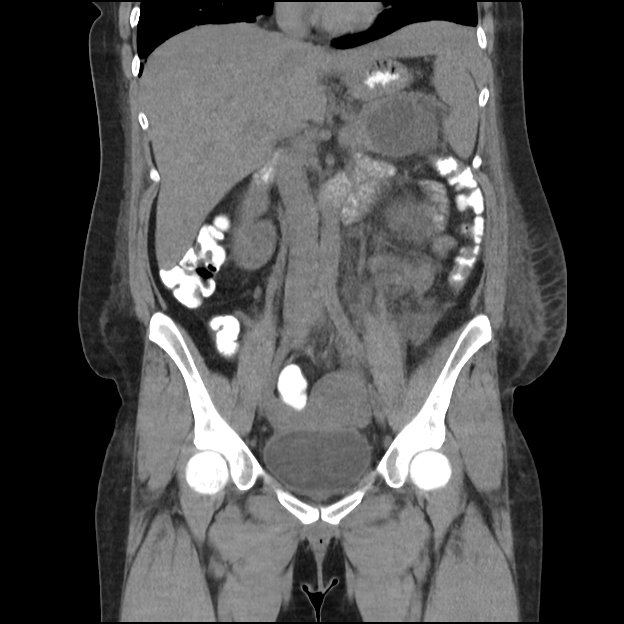
[im 35/63  soft-tissue]
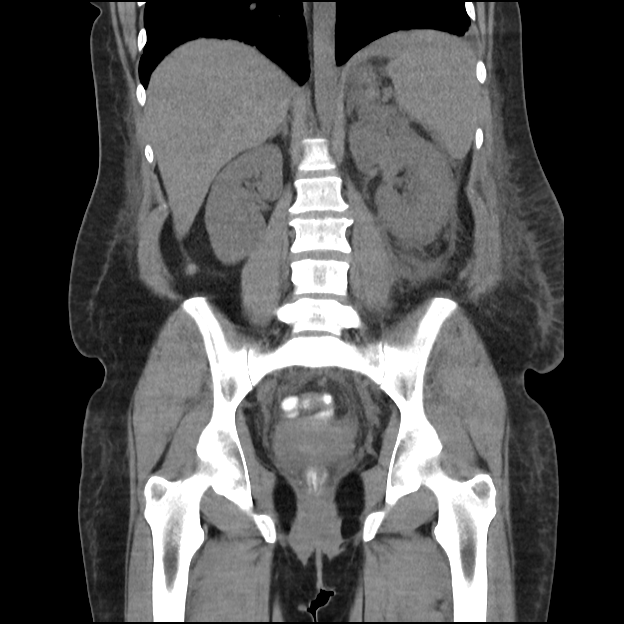

[15 of 46 positions shown; findings below may reference images not displayed]

FINDINGS: ABDOMEN:

Lung Bases: Calcified granuloma is again noted in the medial segment right middle lobe.  There is
persistent atelectasis at the left lung base which is increased.  There is developing left pleural
effusion.  No right pleural effusion is seen.  At the right lung base there is a focal area of
atelectasis which has developed.

Liver: No interval change within the limitations of the study.

Bile ducts: Normal

Gallbladder: No interval change.

Pancreas: Plantar changes are again noted in the tail of the pancreas.  There is a fluid collection
which now measures 4.7 cm x 5.2 cm x 5.5 cm.  There is increasing inflammation around this fluid
collection.

Spleen: Grossly unremarkable.

Adrenals: Unchanged.

Kidneys: Unchanged.

Bowel: Patient had been administered oral contrast.  The small and large bowel are grossly
unremarkable.

Mesenteric lymph nodes: There is no evidence for mesenteric adenopathy.

Peritoneum: There is slightly increasing free fluid in the cul-de-sac.  No free air is identified.

Vessels: No interval changes.

Retroperitoneum: The retroperitoneal fluid on the left is decreased.

Abdominal wall: Air in the subcutaneous tissues in the left lower abdominal wall are consistent
with subcutaneous injection.

Bones: Minimal degenerative disc disease and spondylosis.  Otherwise, unremarkable.

PELVIS:

Appendix: The appendix grossly appears normal.

Bladder: Grossly unremarkable.

Reproductive system: There is unchanged left ovarian cyst.  Uterus and ovaries are otherwise
grossly unremarkable.

Bones: As above.
IMPRESSION: 1.  Essentially unchanged pancreatic tail pseudocyst with increasing associated surrounding
inflammation.  There is decreased retroperitoneal extension of the fluid.

2.  Slightly increased free fluid.

3.  Developing left pleural effusion with bibasilar atelectasis.

4.  Other findings, as noted.

## 2020-07-21 IMAGING — CT CT abd/pelvis W/ IV only
2 of 4 series · 16 of 46 positions shown, 18 images · IV contrast (CE)
Comparison: CT from June 28, 2019

Procedure(s): CT abd/pelvis W/ IV only
PROCEDURE:

CT ABDOMEN AND PELVIS W CONTRAST
HISTORY: Right upper quadrant pain for 4 days.
TECHNIQUE: CT abdomen and pelvis was performed with intravenous contrast. Axial, coronal, and sagittal
reformatted images generated. 80 ml Omnipaque 350 injected IV without complications. Dose lowering
techniques including automated exposure control and adjustment of MAS and KVP according to patient
size was utilized. Preliminary teleradiology report from [HOSPITAL] is reviewed.

[Series 2: soft tissue body 5.0 ce · axial · 0.94mm/px · z∈[+1344,+1834]mm · 13 of 110 slices shown, 15 images]
[im 6/110  soft-tissue]
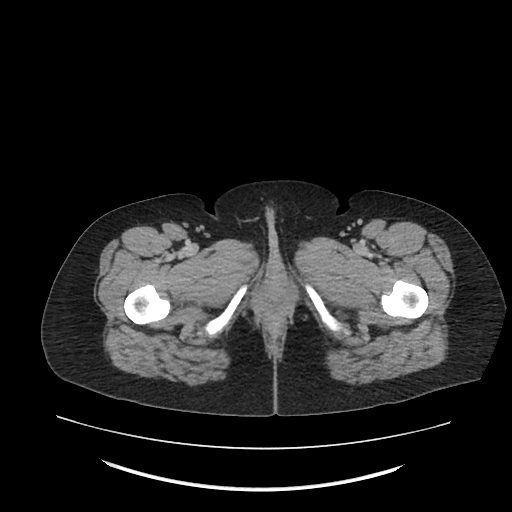
[im 6/110  bone]
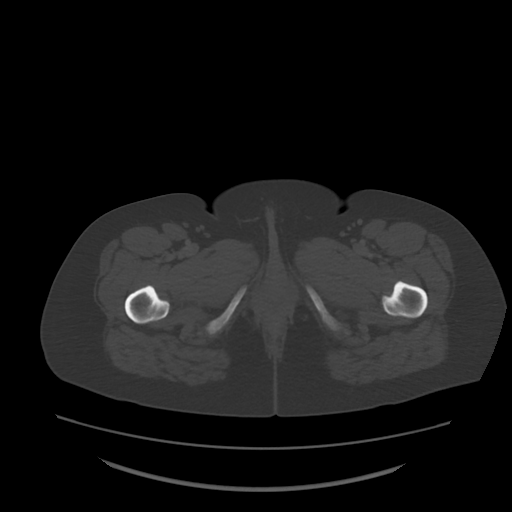
[im 17/110  soft-tissue]
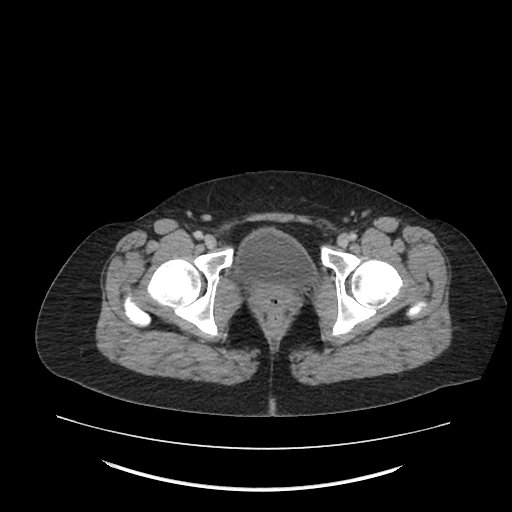
[im 22/110  soft-tissue]
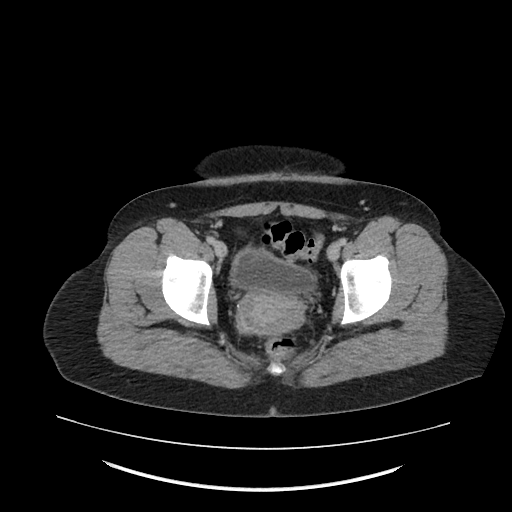
[im 33/110  soft-tissue]
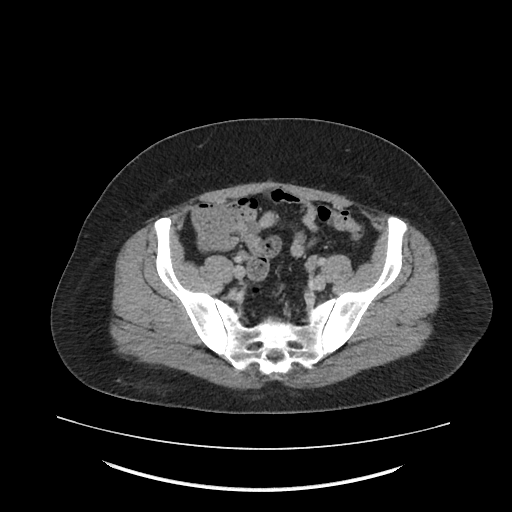
[im 39/110  soft-tissue]
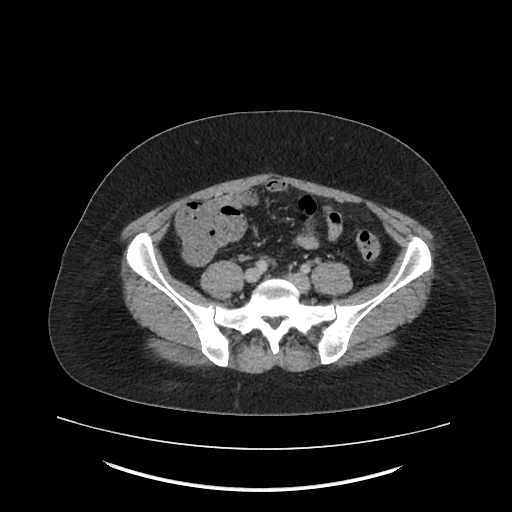
[im 50/110  soft-tissue]
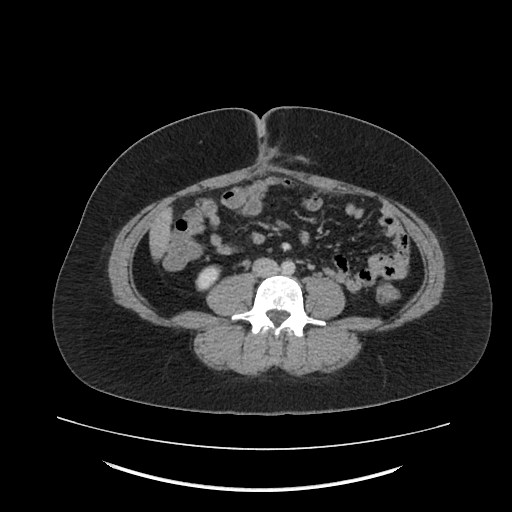
[im 55/110  soft-tissue]
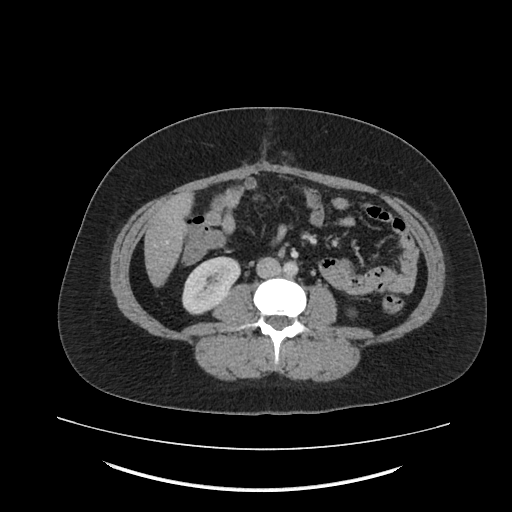
[im 60/110  soft-tissue]
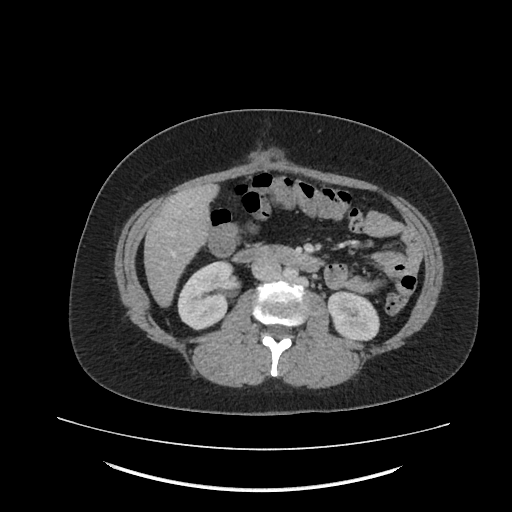
[im 71/110  soft-tissue]
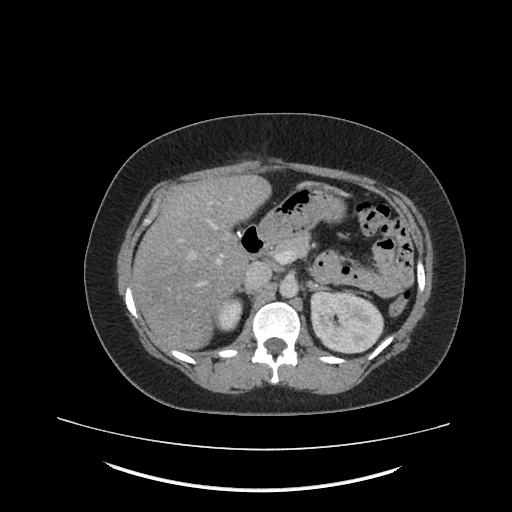
[im 71/110  bone]
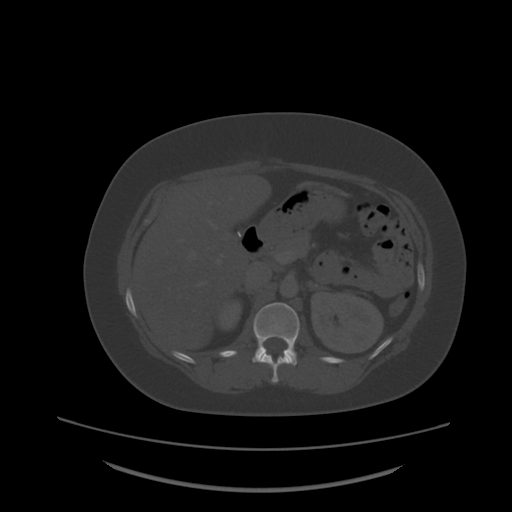
[im 77/110  soft-tissue]
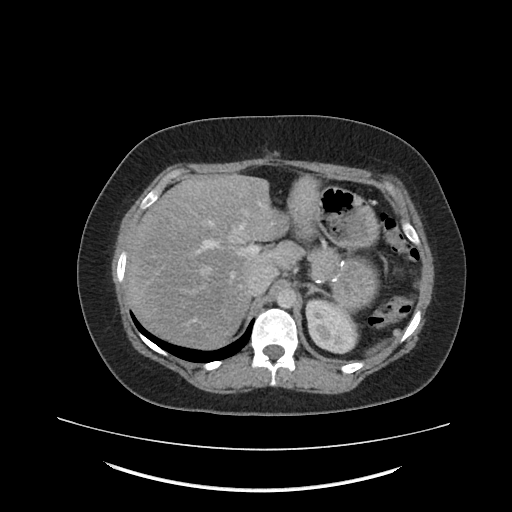
[im 88/110  soft-tissue]
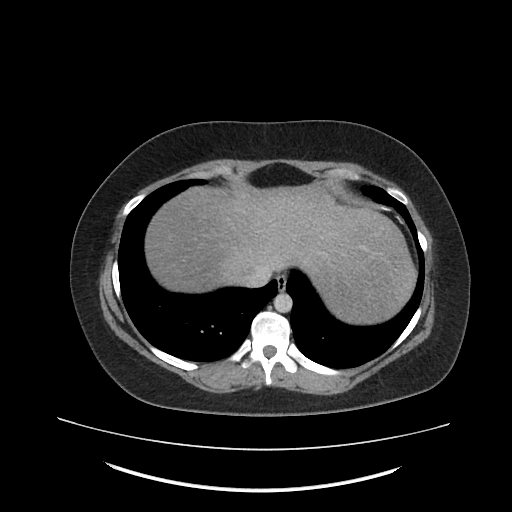
[im 93/110  soft-tissue]
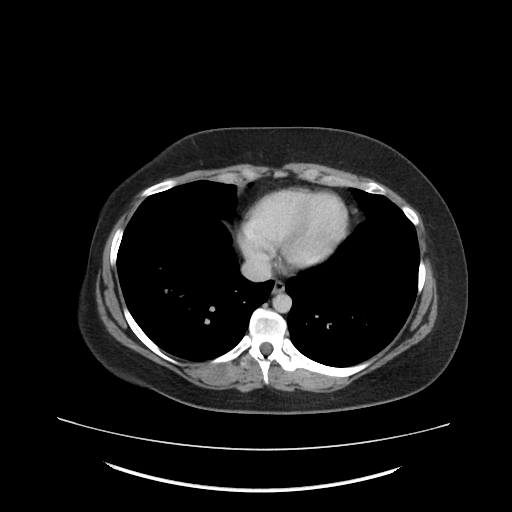
[im 104/110  soft-tissue]
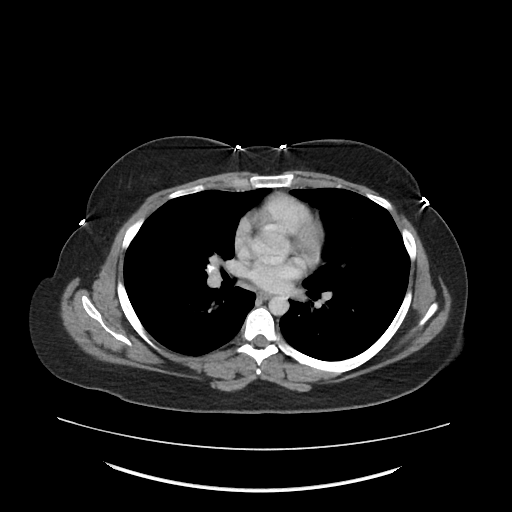

[Series 5: body 5.000 ce · coronal · 0.94mm/px · 3 of 71 slices shown]
[im 24/71  soft-tissue]
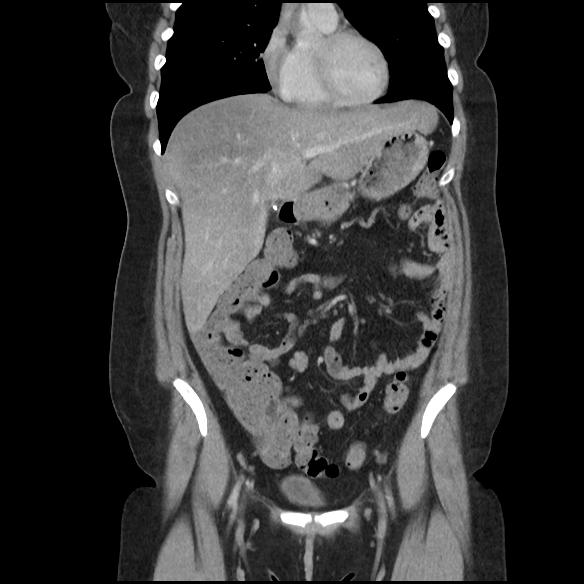
[im 32/71  soft-tissue]
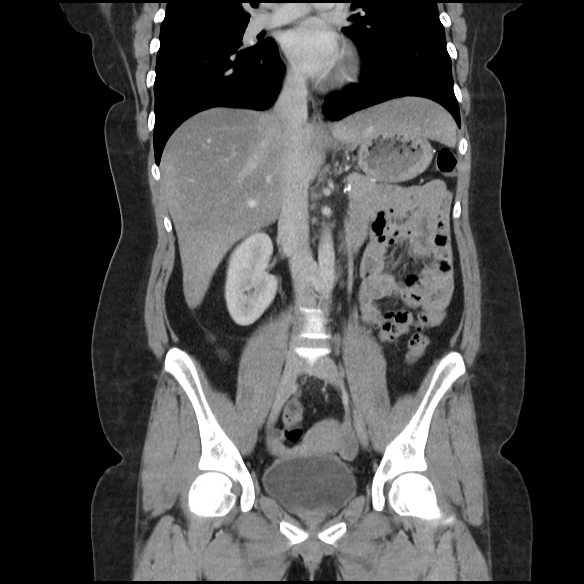
[im 39/71  soft-tissue]
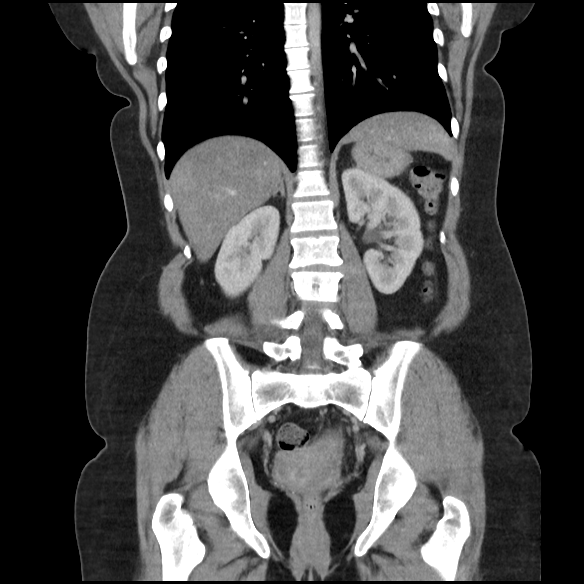

[16 of 46 positions shown; findings below may reference images not displayed]

FINDINGS: ABDOMEN:

Lung Bases: Normal

Liver and bile ducts : Mild hepatic steatosis.  No focal hepatic lesions.  No intrahepatic biliary
ductal dilation.

Gallbladder: Surgically absent

Pancreas: Partial resection changes are present within the tail of the pancreas.  No main
pancreatic duct dilation or focal pancreatic lesions.

Spleen: Surgically absent

Adrenals: Normal

Kidneys and Ureters: Normal

Bowel and Appendix: No pathologically dilated loops of bowel.  Normal appendix.

Mesenteric lymph nodes: Normal

Vessels: Normal

Retroperitoneum: Normal

Abdominal wall: No acute findings.  Small focal fat-containing ventral midline hernia without
evidence of strangulation.

Skeletal: No acute osseous findings.

PELVIS:

Bladder: Normal.

Reproductive system: Normal.

Lymph nodes:Normal
IMPRESSION: 1.  No acute intra-abdominal findings.

2.  Postsurgical changes as above.

3.  Small focal fat-containing ventral midline hernia without evidence of strangulation.

## 2021-02-14 IMAGING — CT CT abd/pelvis W/ IV only
2 of 4 series · 16 of 46 positions shown, 18 images · IV contrast (CE)
Comparison: 07/21/2020

Procedure(s): CT abd/pelvis W/ IV only
PROCEDURE:

CT ABDOMEN AND PELVIS W CONTRAST
HISTORY: Generalized abdominal pain; heartburn; previous reported surgical history of cholecystectomy,
splenectomy, distal pancreatectomy, and hernia surgery
TECHNIQUE: CT abdomen and pelvis was performed with intravenous contrast.  100 mL Omnipaque 350 injected
intravenously. Oral contrast was not administered. Axial, coronal, and sagittal reformatted images
generated.
Dose lowering techniques including Automatic Exposure Control and adjustment of mAs and kVp
according to patient size were utilized.
Preliminary report issued by [HOSPITAL].

[Series 3: soft tissue body 5.0 ce · axial · 0.98mm/px · z∈[+1284,+1784]mm · 13 of 110 slices shown, 15 images]
[im 5/110  soft-tissue]
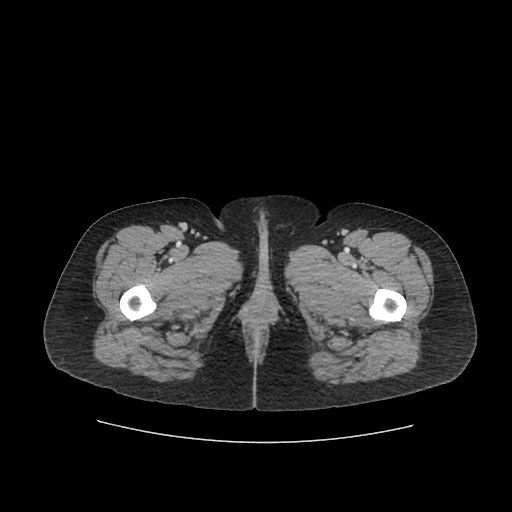
[im 5/110  bone]
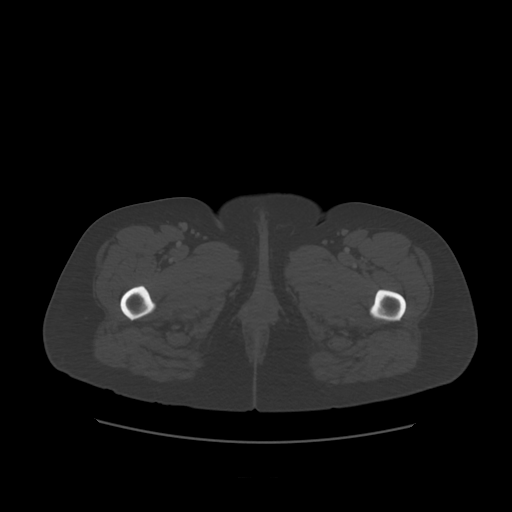
[im 15/110  soft-tissue]
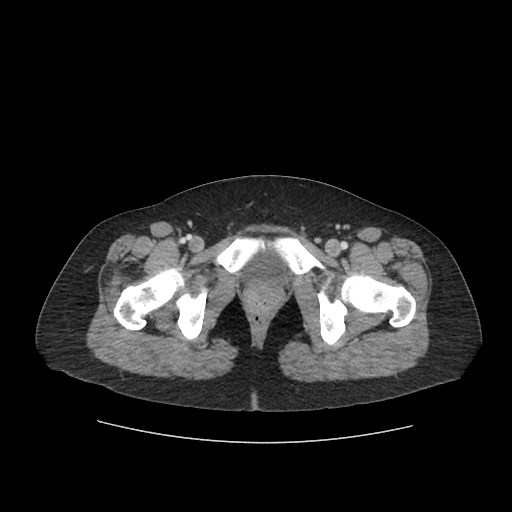
[im 25/110  soft-tissue]
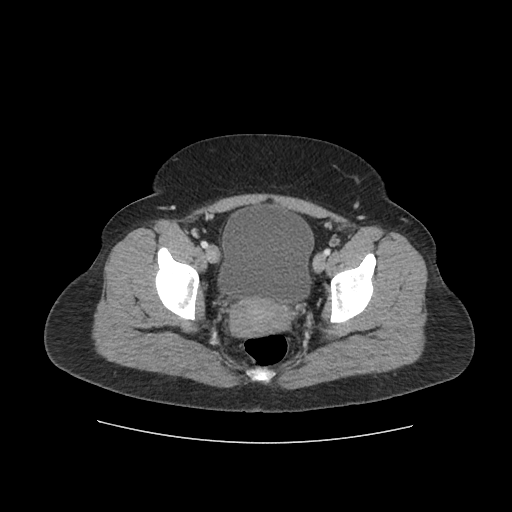
[im 30/110  soft-tissue]
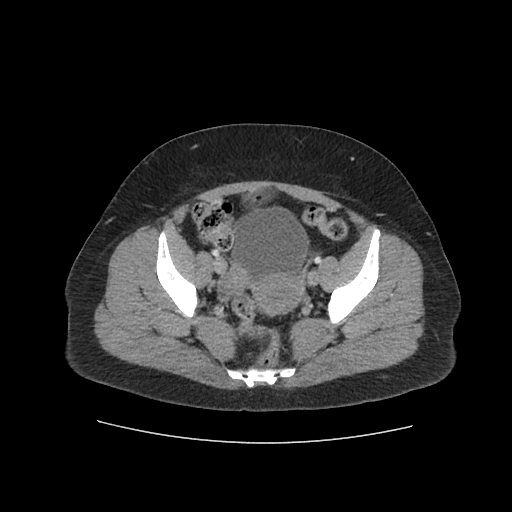
[im 40/110  soft-tissue]
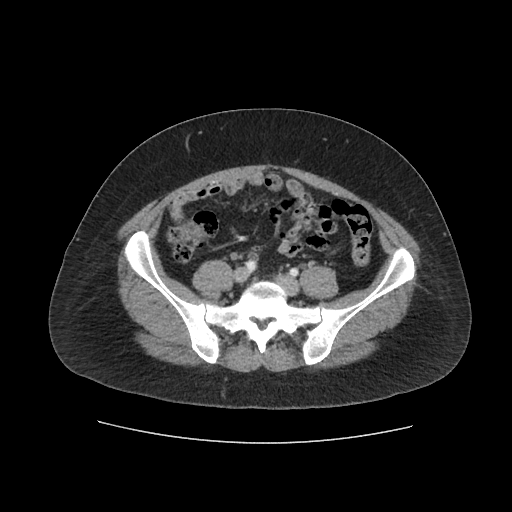
[im 45/110  soft-tissue]
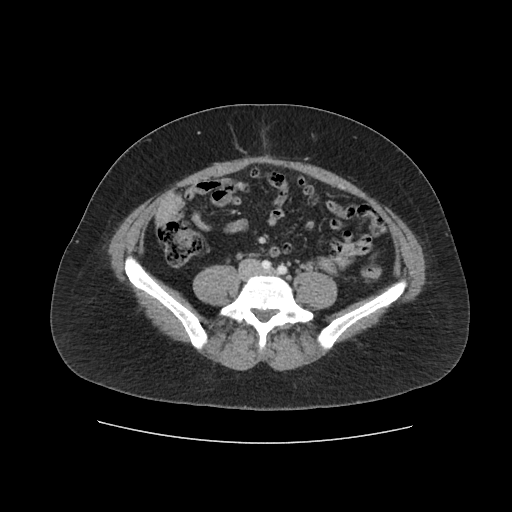
[im 55/110  soft-tissue]
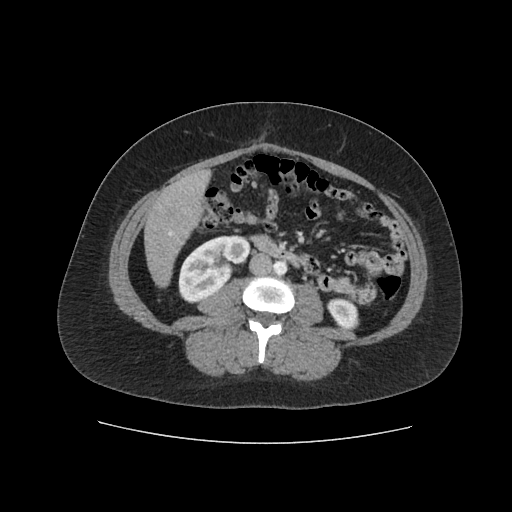
[im 65/110  soft-tissue]
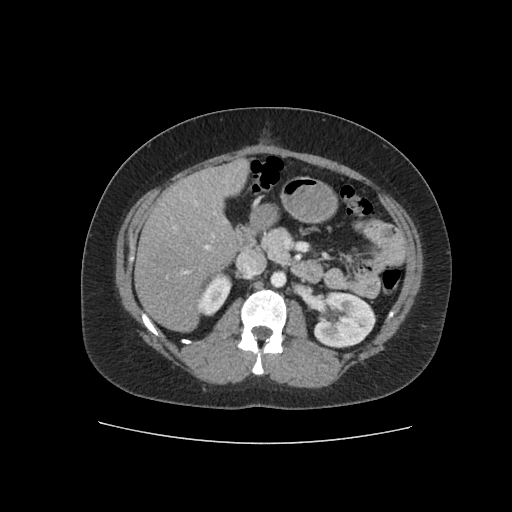
[im 70/110  soft-tissue]
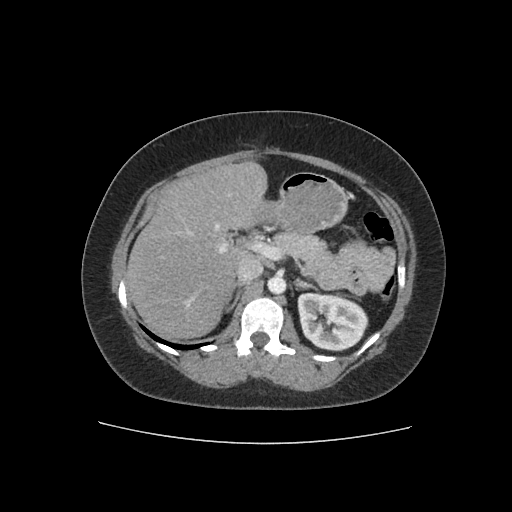
[im 70/110  bone]
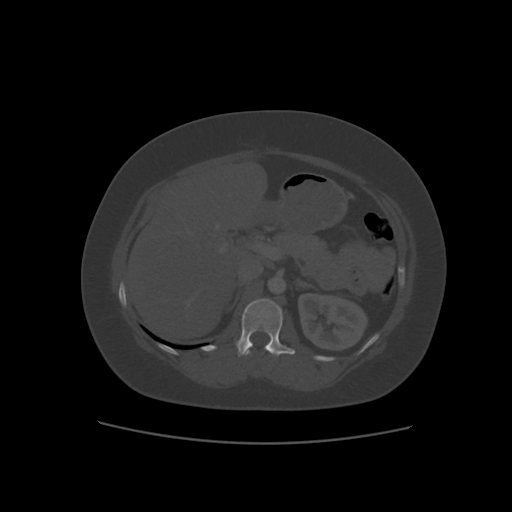
[im 80/110  soft-tissue]
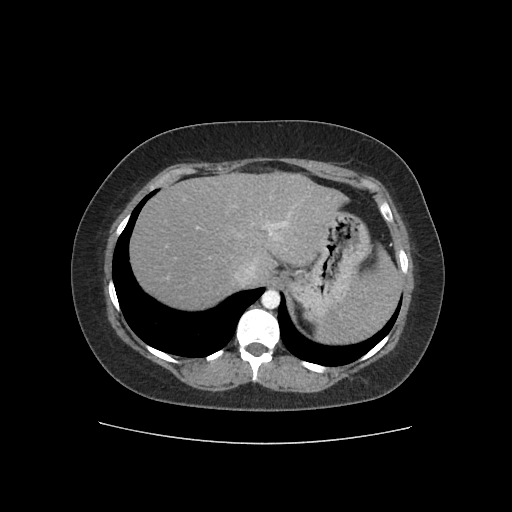
[im 85/110  soft-tissue]
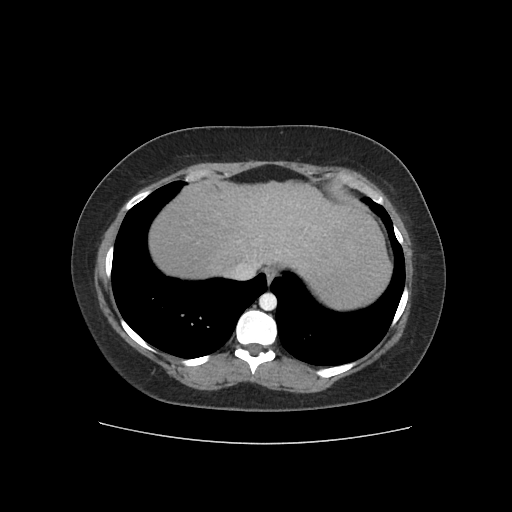
[im 95/110  soft-tissue]
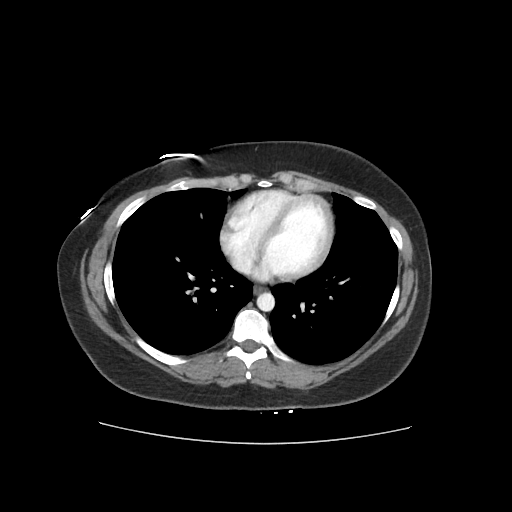
[im 105/110  soft-tissue]
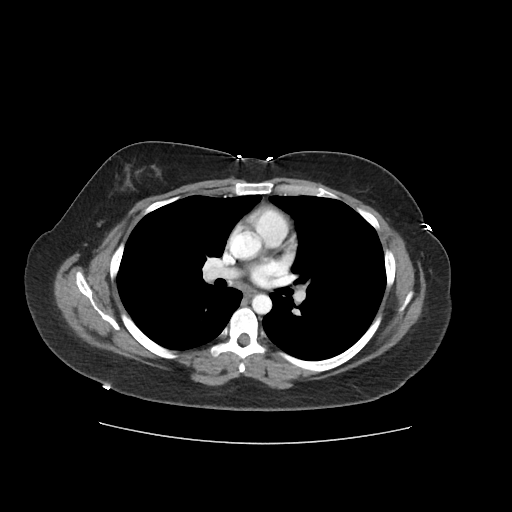

[Series 6: body 5.000 ce · coronal · 0.98mm/px · 3 of 66 slices shown]
[im 22/66  soft-tissue]
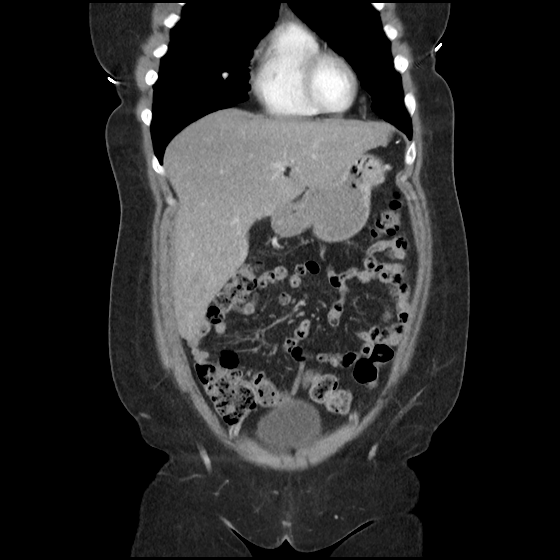
[im 29/66  soft-tissue]
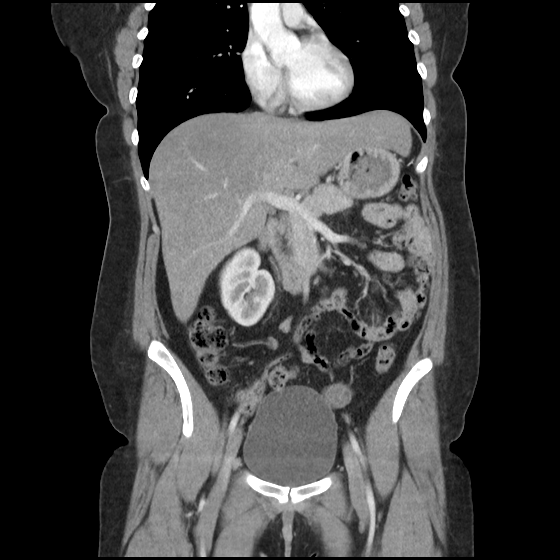
[im 37/66  soft-tissue]
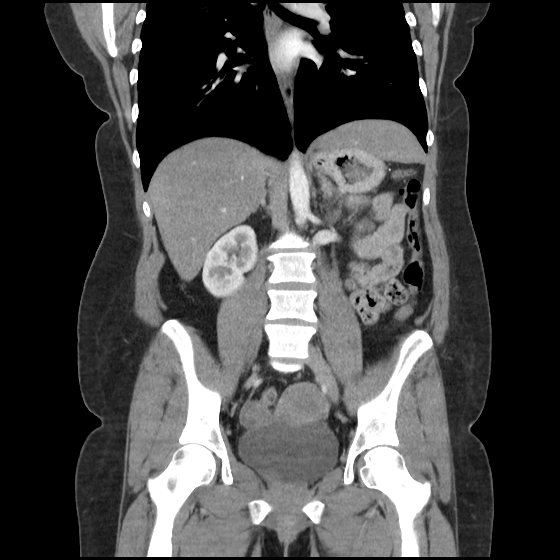

[16 of 46 positions shown; findings below may reference images not displayed]

FINDINGS: ABDOMEN:

Lung Bases: Right middle lobe calcified pulmonary granuloma again noted.  Otherwise grossly clear.

Liver: The liver appears mildly enlarged with diffuse hepatic low attenuation suggestive of fatty
infiltration.  No definite focal hepatic lesions identified.

Bile ducts: Grossly unremarkable

Gallbladder: Surgically absent

Pancreas: Pancreatic tail resection changes are reidentified.  The visualized portions of the
pancreatic body and head appear grossly unremarkable.  No visualized peripancreatic inflammatory
changes or fluid.

Spleen: Surgically absent

Adrenals: Grossly unremarkable

Kidneys: Grossly unremarkable.  No visualized urinary calculi or hydronephrosis.

Bowel: No definite significant bowel dilation or findings of obstruction.  No definite significant
abnormal bowel wall thickening is identified.

Mesenteric lymph nodes: Grossly unremarkable

Peritoneum: No visualized abdominal free fluid or free air.

Vessels: Retroaortic left renal vein.  Otherwise grossly unremarkable.

Retroperitoneum: Grossly unremarkable

Abdominal wall: Postop changes of the ventral abdominal wall again seen with diastases of the
rectus and bulging the Ann-Sissel Bartnes and likely very small focal fat containing ventral midline hernia,
grossly unchanged.  Otherwise grossly unremarkable

Bones: Minimal lumbar levocurvature.  No definite acute osseous findings.

PELVIS:

Appendix: Grossly unremarkable

Bladder: Grossly unremarkable

Reproductive system: Grossly unremarkable

Bones: No definite acute osseous findings.
IMPRESSION: No definite acute CT findings within the abdomen/pelvis.

Mildly enlarged fatty liver.

Multiple postsurgical and chronic findings as noted above.

## 2021-03-13 ENCOUNTER — Ambulatory Visit: Admit: 2021-03-13 | Discharge: 2021-03-13 | Payer: MEDICAID | Attending: Surgery | Primary: Family

## 2021-03-13 ENCOUNTER — Inpatient Hospital Stay: Payer: BLUE CROSS/BLUE SHIELD | Primary: Family

## 2021-03-13 DIAGNOSIS — R109 Unspecified abdominal pain: Secondary | ICD-10-CM

## 2021-03-13 LAB — BASIC METABOLIC PANEL
BUN: 9 mg/dL (ref 7–22)
CO2: 23 meq/L (ref 23–33)
Calcium: 10.2 mg/dL (ref 8.5–10.5)
Chloride: 102 meq/L (ref 98–111)
Creatinine: 0.7 mg/dL (ref 0.4–1.2)
Glucose: 75 mg/dL (ref 70–108)
Potassium: 4.6 meq/L (ref 3.5–5.2)
Sodium: 140 meq/L (ref 135–145)

## 2021-03-13 LAB — EKG 12-LEAD
Atrial Rate: 80 {beats}/min
P Axis: 15 degrees
P-R Interval: 160 ms
Q-T Interval: 394 ms
QRS Duration: 90 ms
QTc Calculation (Bazett): 454 ms
R Axis: 9 degrees
T Axis: 26 degrees
Ventricular Rate: 80 {beats}/min

## 2021-03-13 LAB — HEMOGLOBIN AND HEMATOCRIT
Hematocrit: 46.2 % (ref 37.0–47.0)
Hemoglobin: 15.6 gm/dl (ref 12.0–16.0)

## 2021-03-13 LAB — GLOMERULAR FILTRATION RATE, ESTIMATED: Est, Glom Filt Rate: >90 mL/min/{1.73_m2}

## 2021-03-13 LAB — ANION GAP: Anion Gap: 15 meq/L (ref 8.0–16.0)

## 2021-03-13 NOTE — Telephone Encounter (Signed)
Robotic Surgery Scheduling Form   [facility] [address] [place], [place] [address]    Phone * [phone]     [phone]   Surgical Scheduling Direct line Phone * [phone]  Fax * [phone]      Latayna Ritchie      10/13/1990    female    [address]  [place] [address]  [id number] Marital Status:    Married     Home Phone: [phone]   Cell Phone:   Telephone Information:   Mobile [phone]              Surgeon: Dr. Pat Patrick  Surgery Date:03-23-2021   Time: [facility] [place]     Procedure: Robotic assisted laparoscopy possible lysis of adhesions   Outpatient     Diagnosis: Abdominal pain    Important Medical History: In Epic    Special Inst/Equip:     CPT Codes: 49320    Latex Allergy:   no Cardiac Device:  no    Anesthesia Type: General    Case Location:  Main OR     Preadmission Testing: Phone Call      PAT Date and Time: ________________________________    PAT Confirmation #: _________________________________    Post Op Visit:  ______________________________________    Need Preop Cardiac Clearance:   no    Does patient have Cardiologist/physician?   No    Surgery Conformation #:  ______________________________________________    Scheduler: __________________________________ Date:____________________      RNFA (colon resection only):      [employer] [facility] Name:  [place]

## 2021-03-13 NOTE — Progress Notes (Signed)
[facility] [place] [facility]  [facility]  [address]  [place] [address]  Dept: [phone]  Dept Fax: [phone]  Loc: [phone]    Visit Date: 03/13/2021    Roberta Green is a 30 y.o. 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  In February 2021 she underwent a distal pancreatectomy splenectomy cholecystectomy at the [facility].  She apparently lived in [place] but was referred there.  This was via a open procedure apparently and had a cancerous element to it.  Then on April 13, 2020 and underwent a ventral and surgery repair robotically assisted in [place]..  Patient over the last several weeks been having abdominal pain and was seen at [facility] in [place] in the emergency room and had a CT scan performed that showed no acute issues but suggested possibly a small tenting ventral midline hernia although associated with a diastases of the rectus and bulging at the linea alba this on August 31 recently.  He then went back to that same hospital on September 5 and that note has been reviewed recommended to be seen by a surgeon again was in the process of moving here and has been referred for my evaluation.  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    Diabetes (HCC)     takes Trulicity    Hypertension     Hypovitaminosis D     Obesity       Past Surgical History:   Procedure Laterality Date    CESAREAN SECTION      2012, 2019    CHOLECYSTECTOMY  07/26/2019    FOOT SURGERY Right     HERNIA REPAIR   04/13/2020    Ventral incisional hernia repair    PANCREATECTOMY  07/26/2019    tail of pancreas removed because of tumor    SPLENECTOMY  07/26/2019    WRIST SURGERY Right         Family History   Problem Relation Age of Onset    High Cholesterol Mother     Alcohol Abuse Mother     High Cholesterol Father     Cancer Maternal Grandmother         breast    Dementia Maternal Grandmother     Glaucoma Maternal Grandmother     Lung Disease Maternal Grandfather     Heart Attack Paternal Grandmother     Cancer Paternal Grandmother         hodgkins lymphoma    Heart Disease Paternal Grandfather     Heart Attack Paternal Grandfather         Social History     Tobacco Use    Smoking status: Every Day     Types: Cigarettes    Smokeless tobacco: Never   Substance Use Topics    Alcohol use: Not Currently          Current Outpatient Medications     Medication Sig Dispense Refill    ARIPiprazole (ABILIFY) 15 MG tablet Take 15 mg by mouth daily      aspirin 81 MG EC tablet Take 81 mg by mouth daily      vitamin B-12 (CYANOCOBALAMIN) 500 MCG tablet Take 500 mcg by mouth daily      busPIRone (BUSPAR) 30 MG tablet Take 30 mg by mouth daily      ferrous gluconate (FERGON) 324 (38 Fe) MG tablet Take 324 mg by mouth daily (with breakfast)      folic acid (FOLVITE) 1 MG tablet Take 1 mg by mouth daily      furosemide (LASIX) 40 MG tablet Take 40 mg by mouth daily      linaclotide (LINZESS) 145 MCG capsule Take 145 mcg by mouth every morning (before breakfast)      loratadine (CLARITIN) 10 MG tablet Take 10 mg by mouth daily      LORazepam (ATIVAN) 1 MG tablet Take 1 mg by mouth nightly.      norgestrel-ethinyl estradiol (LOW-OGESTREL) 0.3-30 MG-MCG per tablet Take 1 tablet by mouth daily      metoprolol succinate (TOPROL XL) 25 MG extended release tablet Take 25 mg by mouth daily      ondansetron (ZOFRAN) 8 MG tablet Take 8 mg by mouth every 8 hours as needed for Nausea or Vomiting       No current facility-administered medications for this  visit.     Allergies   Allergen Reactions    Ketamine Hallucinations    Tramadol      disorientation    Tylenol With Codeine #3 [Acetaminophen-Codeine] Nausea And Vomiting       Subjective:      Review of Systems   Constitutional: Negative.  Negative for activity change, appetite change, chills, diaphoresis, fatigue, fever and unexpected weight change.   HENT: Negative.  Negative for congestion, dental problem, drooling, ear discharge, ear pain, facial swelling, hearing loss, mouth sores, nosebleeds, postnasal drip, rhinorrhea, sinus pressure, sinus pain, sneezing, sore throat, tinnitus, trouble swallowing and voice change.    Eyes: Negative.  Negative for photophobia, pain, discharge, redness, itching and visual disturbance.   Respiratory: Negative.  Negative for apnea, cough, choking, chest tightness, shortness of breath, wheezing and stridor.    Cardiovascular: Negative.  Negative for chest pain, palpitations and leg swelling.   Gastrointestinal:  Positive for abdominal pain.   Endocrine: Negative.  Negative for cold intolerance, heat intolerance, polydipsia, polyphagia and polyuria.   Genitourinary: Negative.  Negative for decreased urine volume, difficulty urinating, dyspareunia, dysuria, enuresis, flank pain, frequency, genital sores, hematuria, menstrual problem, pelvic pain, urgency, vaginal bleeding, vaginal discharge and vaginal pain.   Musculoskeletal: Negative.  Negative for arthralgias, back pain, gait problem, joint swelling, myalgias, neck pain and neck stiffness.   Skin:  Negative for color change, pallor, rash and wound.   Allergic/Immunologic: Negative.  Negative for environmental allergies, food allergies and immunocompromised state.   Neurological: Negative.  Negative for dizziness, tremors, seizures, syncope, facial asymmetry, speech difficulty, weakness, light-headedness, numbness and headaches.   Hematological: Negative.  Negative for adenopathy. Does not bruise/bleed easily.    Psychiatric/Behavioral: Negative.  Negative for agitation, behavioral problems, confusion, decreased concentration, dysphoric mood, hallucinations, self-injury, sleep disturbance and suicidal ideas. The patient is not nervous/anxious and is not hyperactive.      Objective:   BP 130/62 (Site: Right Upper Arm, Position: Sitting, Cuff Size: Medium Adult)    Pulse 71    Temp 97.3 ??F (  36.3 ??C) (Temporal)    Resp 18    Wt 203 lb (92.1 kg)    LMP 03/05/2021 (Exact Date)    SpO2 98%     Physical Exam  Constitutional:       Appearance: She is well-developed.   HENT:      Head: Normocephalic and atraumatic.   Eyes:      General: No scleral icterus.        Right eye: No discharge.         Left eye: No discharge.      Conjunctiva/sclera: Conjunctivae normal.      Pupils: Pupils are equal, round, and reactive to light.   Neck:      Thyroid: No thyromegaly.      Vascular: No JVD.   Cardiovascular:      Rate and Rhythm: Normal rate and regular rhythm.      Heart sounds: No murmur heard.    No friction rub. No gallop.   Pulmonary:      Effort: Pulmonary effort is normal. No respiratory distress.      Breath sounds: Normal breath sounds. No stridor. No wheezing.   Chest:      Chest wall: No tenderness.   Abdominal:          Comments: Pain to palpation no definitive hernia palpated   Musculoskeletal:         General: Normal range of motion.      Cervical back: Normal range of motion and neck supple.   Skin:     General: Skin is warm and dry.      Coloration: Skin is not pale.      Findings: No erythema or rash.   Neurological:      Mental Status: She is alert and oriented to person, place, and time.   Psychiatric:         Behavior: Behavior normal.         Thought Content: Thought content normal.         Judgment: Judgment normal.        No results found for this or any previous visit.    Assessment:     She is having persistent pain status post distal pancreatectomy splenectomy cholecystectomy and then hernia repair.  She had a  recent CT scan at a hospital and [place] showed no acute abnormality.  May have a small recurrent hernia I feel her pain is more likely adhesions in nature.  We discussed performing a robotic laparoscopy with possible lysis of adhesions if third recurrent hernias found the neck can be repaired voices understanding and because of persistence of her symptoms would like to proceed    Plan:     1. Schedule Roberta Green for robotic laparoscopy possible lysis of adhesions possible repair of recurrent hernia  2. The risks, benefits and alternatives were discussed with Laylanie. She understands and wishes to proceed with surgical intervention.  3. Restrictions discussed with Roberta Green and she expresses understanding.  4. She is advised to call back directly if there are further questions/concerns, or if her symptoms worsen prior to surgery.          Electronicallysigned by Lequita Halt, MD on 03/14/2021 at 4:35 PM

## 2021-03-13 NOTE — Progress Notes (Deleted)
[facility] [place] [facility]  [facility]  [address]  [place] [address]  Dept: [phone]  Dept Fax: [phone]  Loc: [phone]    Visit Date: 03/13/2021    Roberta Green is a 30 y.o. female who presentstoday for:  Chief Complaint   Patient presents with    Surgical Consult     New patient- referred by Scot Dock, CNP - Abdominal pain       HPI:     HPI    No past medical history on file.   No past surgical history on file.   No family history on file.     Social History     Tobacco Use    Smoking status: Not on file    Smokeless tobacco: Not on file   Substance Use Topics    Alcohol use: Not on file          No current outpatient medications on file.     No current facility-administered medications for this visit.     Not on File    Subjective:      Review of Systems    Objective:   There were no vitals taken for this visit.    Physical Exam     No results found for this or any previous visit.    Assessment:     ***    Plan:     ***      Electronicallysigned by Lequita Halt, MD on 03/13/2021 at 11:40 AM

## 2021-03-13 NOTE — Telephone Encounter (Signed)
Patient scheduled for surgery with Dr. Pat Patrick on 03-23-2021 at 12:00pm with an arrival of 10:00am.  Preop orders (EKG, H/H, BMP) and instructions given.  Surgery consent signed.   Antibacterial soap given.

## 2021-03-15 ENCOUNTER — Encounter

## 2021-03-15 ENCOUNTER — Inpatient Hospital Stay: Admit: 2021-03-15 | Primary: Family

## 2021-03-15 DIAGNOSIS — R52 Pain, unspecified: Secondary | ICD-10-CM

## 2021-03-22 NOTE — Progress Notes (Signed)
[facility] [place] [facility]  [facility]  [address]  [place] [address]  Dept: [phone]  Dept Fax: [phone]  Loc: [phone]    Visit Date: 03/13/2021    Roberta Green is a 30 y.o. 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  In February 2021 she underwent a distal pancreatectomy splenectomy cholecystectomy at the [facility].  She apparently lived in [place] but was referred there.  This was via a open procedure apparently and had a cancerous element to it.  Then on April 13, 2020 and underwent a ventral and surgery repair robotically assisted in [place]  Patient over the last several weeks been having abdominal pain and was seen at [facility] in [facility] in the emergency room and had a CT scan performed that showed no acute issues but suggested possibly a small tenting ventral midline hernia although associated with a diastases of the rectus and bulging at the linea alba this on August 31 recently.  He then went back to that same hospital on September 5 and that note has been reviewed recommended to be seen by a surgeon again was in the process of moving here and has been referred for my evaluation.  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    Diabetes (HCC)     takes Trulicity    Hypertension     Hypovitaminosis D     Obesity       Past Surgical History:   Procedure Laterality Date    CESAREAN SECTION      2012, 2019    CHOLECYSTECTOMY  07/26/2019    FOOT SURGERY Right     HERNIA REPAIR   04/13/2020    Ventral incisional hernia repair    PANCREATECTOMY  07/26/2019    tail of pancreas removed because of tumor    SPLENECTOMY  07/26/2019    WRIST SURGERY Right         Family History   Problem Relation Age of Onset    High Cholesterol Mother     Alcohol Abuse Mother     High Cholesterol Father     Cancer Maternal Grandmother         breast    Dementia Maternal Grandmother     Glaucoma Maternal Grandmother     Lung Disease Maternal Grandfather     Heart Attack Paternal Grandmother     Cancer Paternal Grandmother         hodgkins lymphoma    Heart Disease Paternal Grandfather     Heart Attack Paternal Grandfather         Social History     Tobacco Use    Smoking status: Every Day     Types: Cigarettes    Smokeless tobacco: Never   Substance Use Topics    Alcohol use: Not Currently          Current Outpatient Medications  Medication Sig Dispense Refill    ARIPiprazole (ABILIFY) 15 MG tablet Take 15 mg by mouth daily      aspirin 81 MG EC tablet Take 81 mg by mouth daily      vitamin B-12 (CYANOCOBALAMIN) 500 MCG tablet Take 500 mcg by mouth daily      busPIRone (BUSPAR) 30 MG tablet Take 30 mg by mouth daily      ferrous gluconate (FERGON) 324 (38 Fe) MG tablet Take 324 mg by mouth daily (with breakfast)      folic acid (FOLVITE) 1 MG tablet Take 1 mg by mouth daily      furosemide (LASIX) 40 MG tablet Take 40 mg by mouth daily      linaclotide (LINZESS) 145 MCG capsule Take 145 mcg by mouth every morning (before breakfast)      loratadine (CLARITIN) 10 MG tablet Take 10 mg by mouth daily      LORazepam (ATIVAN) 1 MG tablet Take 1 mg by mouth nightly.      norgestrel-ethinyl estradiol (LOW-OGESTREL) 0.3-30 MG-MCG per tablet Take 1 tablet by mouth daily      metoprolol succinate (TOPROL XL) 25 MG extended release tablet Take 25 mg by mouth daily      ondansetron (ZOFRAN) 8 MG tablet Take 8 mg by mouth every 8 hours as needed for Nausea or Vomiting       No current facility-administered medications for this  visit.     Allergies   Allergen Reactions    Ketamine Hallucinations    Tramadol      disorientation    Tylenol With Codeine #3 [Acetaminophen-Codeine] Nausea And Vomiting       Subjective:      Review of Systems   Constitutional: Negative.  Negative for activity change, appetite change, chills, diaphoresis, fatigue, fever and unexpected weight change.   HENT: Negative.  Negative for congestion, dental problem, drooling, ear discharge, ear pain, facial swelling, hearing loss, mouth sores, nosebleeds, postnasal drip, rhinorrhea, sinus pressure, sinus pain, sneezing, sore throat, tinnitus, trouble swallowing and voice change.    Eyes: Negative.  Negative for photophobia, pain, discharge, redness, itching and visual disturbance.   Respiratory: Negative.  Negative for apnea, cough, choking, chest tightness, shortness of breath, wheezing and stridor.    Cardiovascular: Negative.  Negative for chest pain, palpitations and leg swelling.   Gastrointestinal:  Positive for abdominal pain.   Endocrine: Negative.  Negative for cold intolerance, heat intolerance, polydipsia, polyphagia and polyuria.   Genitourinary: Negative.  Negative for decreased urine volume, difficulty urinating, dyspareunia, dysuria, enuresis, flank pain, frequency, genital sores, hematuria, menstrual problem, pelvic pain, urgency, vaginal bleeding, vaginal discharge and vaginal pain.   Musculoskeletal: Negative.  Negative for arthralgias, back pain, gait problem, joint swelling, myalgias, neck pain and neck stiffness.   Skin:  Negative for color change, pallor, rash and wound.   Allergic/Immunologic: Negative.  Negative for environmental allergies, food allergies and immunocompromised state.   Neurological: Negative.  Negative for dizziness, tremors, seizures, syncope, facial asymmetry, speech difficulty, weakness, light-headedness, numbness and headaches.   Hematological: Negative.  Negative for adenopathy. Does not bruise/bleed easily.    Psychiatric/Behavioral: Negative.  Negative for agitation, behavioral problems, confusion, decreased concentration, dysphoric mood, hallucinations, self-injury, sleep disturbance and suicidal ideas. The patient is not nervous/anxious and is not hyperactive.      Objective:   BP 130/62 (Site: Right Upper Arm, Position: Sitting, Cuff Size: Medium Adult)    Pulse 71    Temp 97.3 ??F (  36.3 ??C) (Temporal)    Resp 18    Wt 203 lb (92.1 kg)    LMP 03/05/2021 (Exact Date)    SpO2 98%     Physical Exam  Constitutional:       Appearance: She is well-developed.   HENT:      Head: Normocephalic and atraumatic.   Eyes:      General: No scleral icterus.        Right eye: No discharge.         Left eye: No discharge.      Conjunctiva/sclera: Conjunctivae normal.      Pupils: Pupils are equal, round, and reactive to light.   Neck:      Thyroid: No thyromegaly.      Vascular: No JVD.   Cardiovascular:      Rate and Rhythm: Normal rate and regular rhythm.      Heart sounds: No murmur heard.    No friction rub. No gallop.   Pulmonary:      Effort: Pulmonary effort is normal. No respiratory distress.      Breath sounds: Normal breath sounds. No stridor. No wheezing.   Chest:      Chest wall: No tenderness.   Abdominal:          Comments: Pain to palpation no definitive hernia palpated   Musculoskeletal:         General: Normal range of motion.      Cervical back: Normal range of motion and neck supple.   Skin:     General: Skin is warm and dry.      Coloration: Skin is not pale.      Findings: No erythema or rash.   Neurological:      Mental Status: She is alert and oriented to person, place, and time.   Psychiatric:         Behavior: Behavior normal.         Thought Content: Thought content normal.         Judgment: Judgment normal.        No results found for this or any previous visit.    Assessment:     She is having persistent pain status post distal pancreatectomy splenectomy cholecystectomy and then hernia repair.  She had a  recent CT scan at a hospital and [place] showed no acute abnormality.  May have a small recurrent hernia I feel her pain is more likely adhesions in nature.  We discussed performing a robotic laparoscopy with possible lysis of adhesions if third recurrent hernias found the neck can be repaired voices understanding and because of persistence of her symptoms would like to proceed    Plan:     1. Schedule Roberta Green for robotic laparoscopy possible lysis of adhesions possible repair of recurrent hernia  2. The risks, benefits and alternatives were discussed with Roberta Green. She understands and wishes to proceed with surgical intervention.  3. Restrictions discussed with Roberta Green and she expresses understanding.  4. She is advised to call back directly if there are further questions/concerns, or if her symptoms worsen prior to surgery.          Electronicallysigned by Lequita Halt, MD on 03/14/2021 at 4:35 PM

## 2021-03-22 NOTE — H&P (Signed)
[name]  History and Physical Update      Pt Name: Roberta Green  MRN: [id number]  Birthdate: [birth year]  Date of evaluation: 03/22/2021    [x]  I have examined the patient and reviewed the H&P/Consult and there are no changes to the patient or plans.    []  I have examined the patient and reviewed the H&P/Consult and have noted the following changes:        , MD  Electronically signed 03/22/2021 at 9:58 AM

## 2021-03-23 ENCOUNTER — Inpatient Hospital Stay
Admit: 2021-03-23 | Discharge: 2021-03-25 | Disposition: A | Discharge status: Home / Self Care | Payer: BLUE CROSS/BLUE SHIELD | Attending: Surgery | Admitting: Surgery

## 2021-03-23 DIAGNOSIS — K432 Incisional hernia without obstruction or gangrene: Secondary | ICD-10-CM

## 2021-03-23 LAB — POTASSIUM: Potassium: 4.6 meq/L (ref 3.5–5.2)

## 2021-03-23 LAB — POCT GLUCOSE: POC Glucose: 83 mg/dl (ref 70–108)

## 2021-03-23 MED ORDER — FENTANYL CITRATE (PF) 100 MCG/2ML IJ SOLN
100 MCG/2ML | INTRAMUSCULAR | Status: AC
Start: 2021-03-23 — End: ?

## 2021-03-23 MED ORDER — GLYCOPYRROLATE 1 MG/5ML IV SOSY
1 MG/5ML | INTRAVENOUS | Status: DC | PRN
Start: 2021-03-23 — End: 2021-03-23
  Administered 2021-03-23: 17:00:00 .1 via INTRAVENOUS

## 2021-03-23 MED ORDER — HYDROMORPHONE HCL 2 MG/ML IJ SOLN
2 MG/ML | INTRAMUSCULAR | Status: AC
Start: 2021-03-23 — End: ?

## 2021-03-23 MED ORDER — HYDROMORPHONE HCL 1 MG/ML IJ SOLN
1 MG/ML | INTRAMUSCULAR | Status: DC | PRN
Start: 2021-03-23 — End: 2021-03-25
  Administered 2021-03-23 – 2021-03-25 (×9): 0.5 mg via INTRAVENOUS

## 2021-03-23 MED ORDER — LIDOCAINE HCL (PF) 2 % IJ SOLN
2 % | INTRAMUSCULAR | Status: DC | PRN
Start: 2021-03-23 — End: 2021-03-23
  Administered 2021-03-23: 17:00:00 100 via INTRAVENOUS

## 2021-03-23 MED ORDER — CEFAZOLIN 2000 MG D5W 50 ML IVPB
Status: AC
Start: 2021-03-23 — End: 2021-03-23
  Administered 2021-03-23: 17:00:00 2000 mg via INTRAVENOUS

## 2021-03-23 MED ORDER — HYDROMORPHONE HCL 2 MG/ML IJ SOLN
2 MG/ML | INTRAMUSCULAR | Status: DC | PRN
Start: 2021-03-23 — End: 2021-03-23
  Administered 2021-03-23: 18:00:00 .5 via INTRAVENOUS
  Administered 2021-03-23: 18:00:00 1.5 via INTRAVENOUS

## 2021-03-23 MED ORDER — FUROSEMIDE 40 MG PO TABS
40 MG | Freq: Every day | ORAL | Status: DC
Start: 2021-03-23 — End: 2021-03-25
  Administered 2021-03-24 – 2021-03-25 (×2): 40 mg via ORAL

## 2021-03-23 MED ORDER — DIPHENHYDRAMINE HCL 50 MG/ML IJ SOLN
50 MG/ML | Freq: Once | INTRAMUSCULAR | Status: DC | PRN
Start: 2021-03-23 — End: 2021-03-23

## 2021-03-23 MED ORDER — ONDANSETRON 4 MG PO TBDP
4 MG | Freq: Three times a day (TID) | ORAL | Status: DC | PRN
Start: 2021-03-23 — End: 2021-03-25

## 2021-03-23 MED ORDER — LORAZEPAM 1 MG PO TABS
1 MG | Freq: Every evening | ORAL | Status: DC
Start: 2021-03-23 — End: 2021-03-25
  Administered 2021-03-24 – 2021-03-25 (×2): 1 mg via ORAL

## 2021-03-23 MED ORDER — ASPIRIN 81 MG PO TBEC
81 MG | Freq: Every day | ORAL | Status: DC
Start: 2021-03-23 — End: 2021-03-25
  Administered 2021-03-24 – 2021-03-25 (×3): 81 mg via ORAL

## 2021-03-23 MED ORDER — SUCCINYLCHOLINE CHLORIDE 20 MG/ML IJ SOLN
20 MG/ML | INTRAMUSCULAR | Status: AC
Start: 2021-03-23 — End: ?

## 2021-03-23 MED ORDER — MIDAZOLAM HCL 2 MG/2ML IJ SOLN
2 MG/ML | INTRAMUSCULAR | Status: AC
Start: 2021-03-23 — End: ?

## 2021-03-23 MED ORDER — BUPIVACAINE-EPINEPHRINE (PF) 0.5% -1:200000 IJ SOLN
INTRAMUSCULAR | Status: DC | PRN
Start: 2021-03-23 — End: 2021-03-23
  Administered 2021-03-23: 18:00:00 30 via INTRADERMAL

## 2021-03-23 MED ORDER — DEXAMETHASONE SOD PHOSPHATE PF 10 MG/ML IJ SOSY
10 MG/ML | INTRAMUSCULAR | Status: DC | PRN
Start: 2021-03-23 — End: 2021-03-23
  Administered 2021-03-23: 17:00:00 10 via INTRAVENOUS

## 2021-03-23 MED ORDER — OXYCODONE HCL 5 MG PO TABS
5 MG | ORAL | Status: AC
Start: 2021-03-23 — End: 2021-03-24

## 2021-03-23 MED ORDER — MIDAZOLAM HCL 2 MG/2ML IJ SOLN
2 MG/ML | INTRAMUSCULAR | Status: DC | PRN
Start: 2021-03-23 — End: 2021-03-23
  Administered 2021-03-23: 16:00:00 2 via INTRAVENOUS

## 2021-03-23 MED ORDER — PROPOFOL 200 MG/20ML IV EMUL
20020 MG/20ML | INTRAVENOUS | Status: DC | PRN
Start: 2021-03-23 — End: 2021-03-23
  Administered 2021-03-23: 17:00:00 160 via INTRAVENOUS

## 2021-03-23 MED ORDER — ROCURONIUM BROMIDE 50 MG/5ML IV SOLN
50 MG/5ML | INTRAVENOUS | Status: DC | PRN
Start: 2021-03-23 — End: 2021-03-23
  Administered 2021-03-23: 17:00:00 30 via INTRAVENOUS
  Administered 2021-03-23: 17:00:00 10 via INTRAVENOUS

## 2021-03-23 MED ORDER — PROPOFOL 200 MG/20ML IV EMUL
200 MG/20ML | INTRAVENOUS | Status: AC
Start: 2021-03-23 — End: ?

## 2021-03-23 MED ORDER — PROPOFOL 1000 MG/100ML IV EMUL
1000100 MG/100ML | INTRAVENOUS | Status: AC
Start: 2021-03-23 — End: ?

## 2021-03-23 MED ORDER — ROCURONIUM BROMIDE 50 MG/5ML IV SOLN
50 MG/5ML | INTRAVENOUS | Status: AC
Start: 2021-03-23 — End: ?

## 2021-03-23 MED ORDER — NORMAL SALINE FLUSH 0.9 % IV SOLN
0.9 % | INTRAVENOUS | Status: DC | PRN
Start: 2021-03-23 — End: 2021-03-25

## 2021-03-23 MED ORDER — FOLIC ACID 1 MG PO TABS
1 MG | Freq: Every day | ORAL | Status: DC
Start: 2021-03-23 — End: 2021-03-25
  Administered 2021-03-24 – 2021-03-25 (×3): 1 mg via ORAL

## 2021-03-23 MED ORDER — DEXAMETHASONE SOD PHOSPHATE PF 10 MG/ML IJ SOSY
10 MG/ML | INTRAMUSCULAR | Status: AC
Start: 2021-03-23 — End: ?

## 2021-03-23 MED ORDER — NORMAL SALINE FLUSH 0.9 % IV SOLN
0.9 % | INTRAVENOUS | Status: DC | PRN
Start: 2021-03-23 — End: 2021-03-23

## 2021-03-23 MED ORDER — VITAMIN B-12 1000 MCG PO TABS
1000 MCG | Freq: Every day | ORAL | Status: DC
Start: 2021-03-23 — End: 2021-03-25
  Administered 2021-03-24 – 2021-03-25 (×3): 500 ug via ORAL

## 2021-03-23 MED ORDER — FERROUS GLUCONATE 240 (27 FE) MG PO TABS
240 (27 Fe) MG | Freq: Every day | ORAL | Status: DC
Start: 2021-03-23 — End: 2021-03-25
  Administered 2021-03-24 – 2021-03-25 (×2): 360 mg via ORAL

## 2021-03-23 MED ORDER — HYDROMORPHONE HCL 1 MG/ML IJ SOLN
1 MG/ML | INTRAMUSCULAR | Status: DC | PRN
Start: 2021-03-23 — End: 2021-03-25

## 2021-03-23 MED ORDER — HYDROMORPHONE HCL 1 MG/ML IJ SOLN
1 MG/ML | INTRAMUSCULAR | Status: AC
Start: 2021-03-23 — End: 2021-03-24

## 2021-03-23 MED ORDER — BUSPIRONE HCL 10 MG PO TABS
10 MG | Freq: Every day | ORAL | Status: DC
Start: 2021-03-23 — End: 2021-03-25
  Administered 2021-03-24 – 2021-03-25 (×3): 30 mg via ORAL

## 2021-03-23 MED ORDER — FENTANYL CITRATE (PF) 100 MCG/2ML IJ SOLN
100 MCG/2ML | INTRAMUSCULAR | Status: DC | PRN
Start: 2021-03-23 — End: 2021-03-23

## 2021-03-23 MED ORDER — LIDOCAINE HCL (PF) 2 % IJ SOLN
2 % | INTRAMUSCULAR | Status: AC
Start: 2021-03-23 — End: ?

## 2021-03-23 MED ORDER — PANTOPRAZOLE SODIUM 40 MG PO TBEC
40 MG | Freq: Every day | ORAL | Status: DC
Start: 2021-03-23 — End: 2021-03-25
  Administered 2021-03-24 – 2021-03-25 (×3): 40 mg via ORAL

## 2021-03-23 MED ORDER — FENTANYL CITRATE (PF) 100 MCG/2ML IJ SOLN
100 MCG/2ML | INTRAMUSCULAR | Status: DC | PRN
Start: 2021-03-23 — End: 2021-03-23
  Administered 2021-03-23 (×3): 50 via INTRAVENOUS
  Administered 2021-03-23: 18:00:00 100 via INTRAVENOUS
  Administered 2021-03-23: 17:00:00 50 via INTRAVENOUS

## 2021-03-23 MED ORDER — NORGESTREL-ETHINYL ESTRADIOL 0.3-30 MG-MCG PO TABS
Freq: Every day | ORAL | Status: DC
Start: 2021-03-23 — End: 2021-03-25
  Administered 2021-03-24: 13:00:00 1 via ORAL

## 2021-03-23 MED ORDER — LINACLOTIDE 145 MCG PO CAPS
145 MCG | Freq: Every day | ORAL | Status: DC
Start: 2021-03-23 — End: 2021-03-25
  Administered 2021-03-24: 10:00:00 145 ug via ORAL

## 2021-03-23 MED ORDER — HYDROMORPHONE HCL 1 MG/ML IJ SOLN
1 MG/ML | INTRAMUSCULAR | Status: DC | PRN
Start: 2021-03-23 — End: 2021-03-23
  Administered 2021-03-23 (×2): 0.25 mg via INTRAVENOUS

## 2021-03-23 MED ORDER — METOPROLOL SUCCINATE ER 25 MG PO TB24
25 MG | Freq: Every day | ORAL | Status: DC
Start: 2021-03-23 — End: 2021-03-25
  Administered 2021-03-24 – 2021-03-25 (×2): 25 mg via ORAL

## 2021-03-23 MED ORDER — BUPIVACAINE-EPINEPHRINE (PF) 0.5% -1:200000 IJ SOLN
INTRAMUSCULAR | Status: AC
Start: 2021-03-23 — End: ?

## 2021-03-23 MED ORDER — MEPERIDINE HCL 25 MG/ML IJ SOLN
25 MG/ML | INTRAMUSCULAR | Status: DC | PRN
Start: 2021-03-23 — End: 2021-03-23

## 2021-03-23 MED ORDER — ONDANSETRON HCL 4 MG/2ML IJ SOLN
4 MG/2ML | Freq: Four times a day (QID) | INTRAMUSCULAR | Status: DC | PRN
Start: 2021-03-23 — End: 2021-03-25

## 2021-03-23 MED ORDER — SODIUM CHLORIDE 0.9 % IV SOLN
0.9 % | INTRAVENOUS | Status: DC | PRN
Start: 2021-03-23 — End: 2021-03-23
  Administered 2021-03-23 (×2): via INTRAVENOUS

## 2021-03-23 MED ORDER — SODIUM CHLORIDE 0.9 % IV SOLN
0.9 % | INTRAVENOUS | Status: DC | PRN
Start: 2021-03-23 — End: 2021-03-25

## 2021-03-23 MED ORDER — NORMAL SALINE FLUSH 0.9 % IV SOLN
0.9 % | Freq: Two times a day (BID) | INTRAVENOUS | Status: DC
Start: 2021-03-23 — End: 2021-03-25
  Administered 2021-03-24 – 2021-03-25 (×3): 10 mL via INTRAVENOUS

## 2021-03-23 MED ORDER — SUGAMMADEX SODIUM 200 MG/2ML IV SOLN
200 MG/2ML | INTRAVENOUS | Status: AC
Start: 2021-03-23 — End: ?

## 2021-03-23 MED ORDER — ARIPIPRAZOLE 15 MG PO TABS
15 MG | Freq: Every day | ORAL | Status: DC
Start: 2021-03-23 — End: 2021-03-25
  Administered 2021-03-24 (×2): 15 mg via ORAL

## 2021-03-23 MED ORDER — SODIUM CHLORIDE 0.9 % IV SOLN
0.9 % | INTRAVENOUS | Status: DC
Start: 2021-03-23 — End: 2021-03-25
  Administered 2021-03-23 – 2021-03-24 (×2): via INTRAVENOUS

## 2021-03-23 MED ORDER — NORMAL SALINE FLUSH 0.9 % IV SOLN
0.9 % | Freq: Two times a day (BID) | INTRAVENOUS | Status: DC
Start: 2021-03-23 — End: 2021-03-23

## 2021-03-23 MED ORDER — ONDANSETRON HCL 4 MG/2ML IJ SOLN
4 MG/2ML | Freq: Once | INTRAMUSCULAR | Status: DC | PRN
Start: 2021-03-23 — End: 2021-03-23

## 2021-03-23 MED ORDER — SUGAMMADEX SODIUM 200 MG/2ML IV SOLN
200 MG/2ML | INTRAVENOUS | Status: DC | PRN
Start: 2021-03-23 — End: 2021-03-23
  Administered 2021-03-23: 18:00:00 200 via INTRAVENOUS

## 2021-03-23 MED ORDER — CETIRIZINE HCL 10 MG PO TABS
10 MG | Freq: Every day | ORAL | Status: DC
Start: 2021-03-23 — End: 2021-03-25
  Administered 2021-03-24 – 2021-03-25 (×3): 10 mg via ORAL

## 2021-03-23 MED ORDER — OXYCODONE HCL 5 MG PO TABS
5 MG | ORAL | Status: DC | PRN
Start: 2021-03-23 — End: 2021-03-25
  Administered 2021-03-23 (×2): 5 mg via ORAL

## 2021-03-23 MED ORDER — SCOPOLAMINE 1 MG/3DAYS TD PT72
13 MG/3DAYS | Freq: Once | TRANSDERMAL | Status: DC
Start: 2021-03-23 — End: 2021-03-25
  Administered 2021-03-23: 14:00:00 1 via TRANSDERMAL

## 2021-03-23 MED ORDER — SODIUM CHLORIDE 0.9 % IV SOLN
0.9 % | INTRAVENOUS | Status: DC | PRN
Start: 2021-03-23 — End: 2021-03-23

## 2021-03-23 MED ORDER — ONDANSETRON HCL 4 MG PO TABS
4 MG | Freq: Three times a day (TID) | ORAL | Status: DC | PRN
Start: 2021-03-23 — End: 2021-03-23

## 2021-03-23 MED ORDER — SODIUM CHLORIDE 0.9 % IV SOLN
0.9 % | INTRAVENOUS | Status: DC
Start: 2021-03-23 — End: 2021-03-23
  Administered 2021-03-23: 15:00:00 via INTRAVENOUS

## 2021-03-23 MED ORDER — OXYCODONE HCL 5 MG PO TABS
5 MG | ORAL | Status: DC | PRN
Start: 2021-03-23 — End: 2021-03-25
  Administered 2021-03-24 – 2021-03-25 (×6): 10 mg via ORAL

## 2021-03-23 MED ORDER — SUCCINYLCHOLINE CHLORIDE 20 MG/ML IJ SOLN
20 MG/ML | INTRAMUSCULAR | Status: DC | PRN
Start: 2021-03-23 — End: 2021-03-23
  Administered 2021-03-23: 17:00:00 60 via INTRAVENOUS

## 2021-03-23 MED FILL — HYDROMORPHONE HCL 2 MG/ML IJ SOLN: 2 MG/ML | INTRAMUSCULAR | Qty: 1

## 2021-03-23 MED FILL — LIDOCAINE HCL (PF) 2 % IJ SOLN: 2 % | INTRAMUSCULAR | Qty: 5

## 2021-03-23 MED FILL — ABILIFY 15 MG PO TABS: 15 MG | ORAL | Qty: 1

## 2021-03-23 MED FILL — PROPOFOL 200 MG/20ML IV EMUL: 200 MG/20ML | INTRAVENOUS | Qty: 20

## 2021-03-23 MED FILL — DEXAMETHASONE SOD PHOSPHATE PF 10 MG/ML IJ SOSY: 10 MG/ML | INTRAMUSCULAR | Qty: 1

## 2021-03-23 MED FILL — BUPIVACAINE-EPINEPHRINE (PF) 0.5% -1:200000 IJ SOLN: INTRAMUSCULAR | Qty: 30

## 2021-03-23 MED FILL — FOLIC ACID 1 MG PO TABS: 1 MG | ORAL | Qty: 1

## 2021-03-23 MED FILL — HYDROMORPHONE HCL 1 MG/ML IJ SOLN: 1 MG/ML | INTRAMUSCULAR | Qty: 1

## 2021-03-23 MED FILL — BRIDION 200 MG/2ML IV SOLN: 200 MG/2ML | INTRAVENOUS | Qty: 2

## 2021-03-23 MED FILL — SUCCINYLCHOLINE CHLORIDE 20 MG/ML IJ SOLN: 20 MG/ML | INTRAMUSCULAR | Qty: 10

## 2021-03-23 MED FILL — ROCURONIUM BROMIDE 50 MG/5ML IV SOLN: 50 MG/5ML | INTRAVENOUS | Qty: 5

## 2021-03-23 MED FILL — VITAMIN B-12 1000 MCG PO TABS: 1000 MCG | ORAL | Qty: 1

## 2021-03-23 MED FILL — FUROSEMIDE 40 MG PO TABS: 40 MG | ORAL | Qty: 1

## 2021-03-23 MED FILL — OXYCODONE HCL 5 MG PO TABS: 5 MG | ORAL | Qty: 1

## 2021-03-23 MED FILL — FENTANYL CITRATE (PF) 100 MCG/2ML IJ SOLN: 100 MCG/2ML | INTRAMUSCULAR | Qty: 2

## 2021-03-23 MED FILL — DIPRIVAN 1000 MG/100ML IV EMUL: 1000 MG/100ML | INTRAVENOUS | Qty: 100

## 2021-03-23 MED FILL — MIDAZOLAM HCL 2 MG/2ML IJ SOLN: 2 MG/ML | INTRAMUSCULAR | Qty: 2

## 2021-03-23 MED FILL — BUSPIRONE HCL 10 MG PO TABS: 10 MG | ORAL | Qty: 3

## 2021-03-23 MED FILL — DILAUDID 1 MG/ML IJ SOLN: 1 MG/ML | INTRAMUSCULAR | Qty: 0.5

## 2021-03-23 MED FILL — CETIRIZINE HCL 10 MG PO TABS: 10 MG | ORAL | Qty: 1

## 2021-03-23 MED FILL — TRANSDERM-SCOP 1 MG/3DAYS TD PT72: 1 MG/3DAYS | TRANSDERMAL | Qty: 1

## 2021-03-23 MED FILL — CEFAZOLIN 2000 MG D5W 50 ML IVPB: Qty: 50

## 2021-03-23 NOTE — Brief Op Note (Signed)
Brief Postoperative Note      Patient: Roberta Green  Date of Birth: 04-26-1991  MRN: [id number]    Date of Procedure: 03/23/2021    Pre-Op Diagnosis: Abdominal pain, unspecified abdominal location     Post-Op Diagnosis: 1.Adhesions  2.Recurrent Ventral Hernia       Procedure(s):  Robotic Assisted Laparoscopy, Extensive Lysis of Adhesion, Repair of Insional Hernia with Mesh    Surgeon(s):  Lequita Halt, MD    Assistant:  First Assistant: Madlyn Frankel Ward, RN    Anesthesia: General    Estimated Blood Loss (mL): 30 ml    Complications: none    Specimens:       Implants:  Implant Name Type Inv. Item Serial No. Manufacturer Lot No. LRB No. Used Action   MESH HERN DIA4.5IN CIR W/ ECHO PS POS SYS VENTRALIGHT ST - [id number]  MESH HERN DIA4.5IN CIR W/ ECHO PS POS SYS VENTRALIGHT ST  BARD DAVOL-WD [id number] N/A 1 Implanted         Drains:   NG/OG/NJ/NE Tube Orogastric Left mouth (Active)       Findings: see op note    Electronically signed by Lequita Halt, MD on 03/23/2021 at 2:00 PM

## 2021-03-23 NOTE — Anesthesia Pre-Procedure Evaluation (Addendum)
Department of Anesthesiology  Preprocedure Note       Name:  Roberta Green   Age:  30 y.o.  DOB:  [birth year]                                          MRN:  [id number]         Date:  03/23/2021      Surgeon: Moishe Spice):  Lequita Halt, MD    Procedure: Procedure(s):  Robotic Assisted Laparoscopy possible Lysis of Adhesions    Medications prior to admission:   Prior to Admission medications    Medication Sig Start Date End Date Taking? Authorizing Provider   ARIPiprazole (ABILIFY) 15 MG tablet Take 15 mg by mouth daily    Historical Provider, MD   aspirin 81 MG EC tablet Take 81 mg by mouth daily    Historical Provider, MD   vitamin B-12 (CYANOCOBALAMIN) 500 MCG tablet Take 500 mcg by mouth daily    Historical Provider, MD   busPIRone (BUSPAR) 30 MG tablet Take 30 mg by mouth daily    Historical Provider, MD   ferrous gluconate (FERGON) 324 (38 Fe) MG tablet Take 324 mg by mouth daily (with breakfast)    Historical Provider, MD   folic acid (FOLVITE) 1 MG tablet Take 1 mg by mouth daily    Historical Provider, MD   furosemide (LASIX) 40 MG tablet Take 40 mg by mouth daily    Historical Provider, MD   linaclotide (LINZESS) 145 MCG capsule Take 145 mcg by mouth every morning (before breakfast)    Historical Provider, MD   loratadine (CLARITIN) 10 MG tablet Take 10 mg by mouth daily    Historical Provider, MD   LORazepam (ATIVAN) 1 MG tablet Take 1 mg by mouth nightly.    Historical Provider, MD   norgestrel-ethinyl estradiol (LOW-OGESTREL) 0.3-30 MG-MCG per tablet Take 1 tablet by mouth daily    Historical Provider, MD   metoprolol succinate (TOPROL XL) 25 MG extended release tablet Take 25 mg by mouth daily    Historical Provider, MD   ondansetron (ZOFRAN) 8 MG tablet Take 8 mg by mouth every 8 hours as needed for Nausea or Vomiting    Historical Provider, MD       Current medications:    Current Facility-Administered Medications   Medication Dose Route Frequency Provider Last Rate Last Admin   ??? 0.9 % sodium  chloride infusion   IntraVENous Continuous Lequita Halt, MD       ??? ceFAZolin (ANCEF) 2000 mg in dextrose 5 % 50 mL IVPB  2,000 mg IntraVENous 30 Min Pre-Op Lequita Halt, MD           Allergies:    Allergies   Allergen Reactions   ??? Ketamine Hallucinations   ??? Tramadol      disorientation   ??? Tylenol With Codeine #3 [Acetaminophen-Codeine] Nausea And Vomiting       Problem List:  There is no problem list on file for this patient.      Past Medical History:        Diagnosis Date   ??? Anemia    ??? 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 Diabetes (HCC)     takes Trulicity   ??? Hypertension    ??? Hypovitaminosis D    ??? Obesity  Past Surgical History:        Procedure Laterality Date   ??? CESAREAN SECTION      2012, 2019   ??? CHOLECYSTECTOMY  07/26/2019   ??? FOOT SURGERY Right    ??? HERNIA REPAIR  04/13/2020    Ventral incisional hernia repair   ??? PANCREATECTOMY  07/26/2019    tail of pancreas removed because of tumor   ??? SPLENECTOMY  07/26/2019   ??? TUBAL LIGATION      2019   ??? WRIST SURGERY Right        Social History:    Social History     Tobacco Use   ??? Smoking status: Every Day     Types: Cigarettes   ??? Smokeless tobacco: Never   Substance Use Topics   ??? Alcohol use: Not Currently                                Ready to quit: Not Answered  Counseling given: Not Answered      Vital Signs (Current):   Vitals:    03/23/21 1000 03/23/21 1002   BP: 134/78    Pulse: 85    Resp: 18    Temp: 97 ??F (36.1 ??C)    TempSrc: Temporal    Weight:  205 lb 6.4 oz (93.2 kg)   Height:   (1.651 m)                                              BP Readings from Last 3 Encounters:   03/23/21 134/78   03/13/21 130/62       NPO Status:                                                                                 BMI:   Wt Readings from Last 3 Encounters:   03/23/21 205 lb 6.4 oz (93.2 kg)   03/13/21 203 lb (92.1 kg)     Body mass index is 34.18 kg/m??.    CBC:   Lab Results   Component Value Date/Time     HGB 15.6 03/13/2021 01:52 PM    HCT 46.2 03/13/2021 01:52 PM       CMP:   Lab Results   Component Value Date/Time    NA 140 03/13/2021 01:52 PM    K 4.6 03/13/2021 01:52 PM    CL 102 03/13/2021 01:52 PM    CO2 23 03/13/2021 01:52 PM    BUN 9 03/13/2021 01:52 PM    CREATININE 0.7 03/13/2021 01:52 PM    LABGLOM >90 03/13/2021 01:52 PM    GLUCOSE 75 03/13/2021 01:52 PM    CALCIUM 10.2 03/13/2021 01:52 PM       POC Tests: No results for input(s): POCGLU, POCNA, POCK, POCCL, POCBUN, POCHEMO, POCHCT in the last 72 hours.    Coags: No results found for: PROTIME, INR, APTT    HCG (If Applicable): No results found for: PREGTESTUR, PREGSERUM, HCG, HCGQUANT  ABGs: No results found for: PHART, PO2ART, PCO2ART, HCO3ART, BEART, O2SATART     Type & Screen (If Applicable):  No results found for: LABABO, LABRH    Drug/Infectious Status (If Applicable):  No results found for: HIV, HEPCAB    COVID-19 Screening (If Applicable): No results found for: COVID19        Anesthesia Evaluation     history of anesthetic complications: PONV.  Airway: Mallampati: II  TM distance: >3 FB   Neck ROM: full  Mouth opening: > = 3 FB   Dental: normal exam         Pulmonary:normal exam        (-) COPD and asthma                           Cardiovascular:Negative CV ROS  Exercise tolerance: good (>4 METS),   (+) hypertension:, dysrhythmias (takes Lopressor): SVT,     (-) past MI and CAD                Neuro/Psych:      (-) seizures and CVA           GI/Hepatic/Renal:   (+) liver disease (fatty liver):,      (-) GERD and no renal disease       Endo/Other:    (+) DiabetesType II DM, , .    (-) hypothyroidism, hyperthyroidism               Abdominal:   (+) obese,           Vascular:     - DVT.      Other Findings:           Anesthesia Plan      general     ASA 2     (GETA. PIV. Additional access can be obtained after induction if needed. Standard ASA monitors. IV/PO opioids and other adjuncts as needed for pain control. PACU post op for  recovery.    Possible anesthetics complications were discussed with the patient, including but not limited to: PONV, damage to the airway and surrounding structures (teeth, lips, gums, tongue, etc.), adverse reactions to medicine, cardiac complications (MI, CHF, arrhythmias, etc.), respiratory complications (post-op ventilation, respiratory failure, etc.), neurologic complications (nerve damage, stroke, seizure), and death. The patient was given the opportunity to ask questions and all questions were answered to the patient's satisfaction. The patient is in agreement with the anesthetic plan.  )  Induction: intravenous.    MIPS: Prophylactic antiemetics administered.  Anesthetic plan and risks discussed with patient and mother.      Plan discussed with CRNA.                    Eloy End, DO   03/23/2021

## 2021-03-23 NOTE — Progress Notes (Signed)
Report called to [facility] RN.

## 2021-03-23 NOTE — Op Note (Addendum)
[facility]                    [address] [place] [address]                                OPERATIVE REPORT    PATIENT NAME: Roberta Green, Roberta Green                    DOB:        May 25, 1991  MED REC NO:   [id number]                           ROOM:       0007  ACCOUNT NO:   [id number]                           ADMIT DATE: 03/23/2021  PROVIDER:     Jim Desanctis. Brennyn Ortlieb, MD    DATE OF PROCEDURE:  03/23/2021    PREOPERATIVE DIAGNOSES:  Abdominal pain, probable adhesions.    POSTOPERATIVE DIAGNOSES:  Adhesions with ventral/incisional hernia.    OPERATIONS:  1.  Robotic-assisted lysis of extensive adhesions.  2.  Ventral hernia repair with 4.5-inch Echo PS Ventralight mesh.    SURGEON:  Jim Desanctis. Nicci Vaughan, MD    ANESTHESIA:  General.    COMPLICATIONS:  None.  Blood Loss 30 mL  INDICATION FOR PROCEDURE:  The patient is a 30 year old white female who  has had a previous distal pancreatectomy, splenectomy, ventral hernia  repair, cholecystectomy; who was having significant and ongoing  periumbilical pain.  It was felt that adhesions were likely and she is  here for same.    FINDINGS:  The patient did have mostly omental adhesions up to the  peritoneum.  There were some loops of bowel but, otherwise, mostly fat.   The patient had a previous mesh identified.  The patient, however, at  the umbilicus had another hernia.  It was repaired with a 4.5-inch Echo  PS mesh.    DESCRIPTION OF PROCEDURE:  The patient was brought into the operating  suite, placed supine on the operating room table.  After adequate  inhalational anesthesia was administered, the patient's abdomen was  prepped and draped in the usual sterile fashion.  Going right below the  umbilicus, we inserted a Veress needle and CO2 was insufflated to a  pressure of 15.  Veress needle was removed and then used an OptiView  trocar to gain access to the abdominal cavity in the left upper  quadrant.  As we put the camera in after we removed  the OptiView port,  there were adhesions right there starting on the left peritoneal wall.   We put the other two robotic ports in under direct visualization.  I  took my place at the module and the robot was brought in and docked to  the ports.  We then began the lysis of extensive adhesions of omentum  and fatty tissue and as we worked towards the falciform ligament, there  were some loops of bowel that were attached out.  We were easily able to  free those up without apparent injury.  We then could see the old mesh  that had been placed, it was clearly in the upper abdomen; and then as  we worked towards the umbilicus freeing up  adhesions, it was clear that  there was a ventral hernia at the umbilicus.  We freed up all of the  adhesions so that the peritoneum was free.  We then inserted a 0  Stratafix suture and closed the defect and inserted an Echo PS  Ventralight mesh 4.5 inch in the abdominal cavity.  The grasper was  brought through the closed defect.  The catheter for the exoskeleton of  the mesh was brought out to the outside and it was insufflated and the  mesh was brought up right to the peritoneal wall.  We then sutured the  mesh to the peritoneum with a running 0-PDS suture.  The two needles  were removed.  The exoskeleton was removed off.  The mesh was removed,  and we did take pictures throughout this process.  All the adhesions  were lysed.  There were no other abnormalities noted and then closure  was begun.  The trocars were removed as air was aspirated out of the  abdominal cavity.  Interrupted 4-0 Vicryl was used to close the skin.   Steri-Strips were applied.        [name] J. [facility], MD    D: 03/23/2021 14:09:15       T: 03/23/2021 14:13:00     [id number]  Job#: [id number]     Doc#: [id number]    CC:

## 2021-03-23 NOTE — Progress Notes (Signed)
The patient was given sierra mist. Mother remains in the room.

## 2021-03-23 NOTE — Progress Notes (Signed)
Patient returned from recovery room. Awake. Pain 4/10. Mother in room.

## 2021-03-23 NOTE — Progress Notes (Signed)
ADMITTED TO SDS AND ORIENTED TO UNIT. SCDS ON. FALL AND ALLERGY BANDS ON. PT VERBALIZED APPROVAL FOR FIRST NAME, LAST INITIAL AND PHYSICIAN NAME ON UNIT WHITEBOARD. Mother, Adrianne with the patient.

## 2021-03-23 NOTE — Progress Notes (Signed)
1411- pt to pacu, pt alert and oriented x4, resp easy and unlabored, VSS, pt appears in no acute distress  1415- dilaudid given for pain  1420- dilaudid given for pain  1430- pt resting in bed with eyes closed, resp easy and unlabored, VSS, to appears in no acute distress, pt states pain is 3/10  1440- pt remains in stable condition, pt states pain remains tolerable, pt appears in no acute distress  1445- pt transported to SDS, report given to [employer]

## 2021-03-24 LAB — CBC WITH AUTO DIFFERENTIAL
Basophils Absolute: 0.1 10*3/uL (ref 0.0–0.1)
Basophils: 0.4 %
Eosinophils Absolute: 0.2 10*3/uL (ref 0.0–0.4)
Eosinophils: 0.8 %
Hematocrit: 38.7 % (ref 37.0–47.0)
Hemoglobin: 13.2 gm/dl (ref 12.0–16.0)
Immature Grans (Abs): 0.11 10*3/uL — ABNORMAL HIGH (ref 0.00–0.07)
Immature Granulocytes: 0.4 %
Lymphocytes Absolute: 3.2 10*3/uL (ref 1.0–4.8)
Lymphocytes: 13.1 %
MCH: 31.2 pg (ref 26.0–33.0)
MCHC: 34.1 gm/dl (ref 32.2–35.5)
MCV: 91.5 fL (ref 81.0–99.0)
MPV: 10.5 fL (ref 9.4–12.4)
Monocytes Absolute: 1.7 10*3/uL — ABNORMAL HIGH (ref 0.4–1.3)
Monocytes: 6.9 %
Platelets: 362 10*3/uL (ref 130–400)
RBC: 4.23 10*6/uL (ref 4.20–5.40)
RDW-CV: 14.3 % (ref 11.5–14.5)
RDW-SD: 47.9 fL — ABNORMAL HIGH (ref 35.0–45.0)
Seg Neutrophils: 78.4 %
Segs Absolute: 19.2 10*3/uL — ABNORMAL HIGH (ref 1.8–7.7)
WBC: 24.5 10*3/uL — ABNORMAL HIGH (ref 4.8–10.8)
nRBC: 0 /100 wbc

## 2021-03-24 MED ORDER — NALOXEGOL OXALATE 12.5 MG PO TABS
12.5 MG | Freq: Every day | ORAL | Status: DC
Start: 2021-03-24 — End: 2021-03-25
  Administered 2021-03-25: 10:00:00 12.5 mg via ORAL

## 2021-03-24 MED ORDER — DOCUSATE SODIUM 100 MG PO CAPS
100 MG | Freq: Two times a day (BID) | ORAL | Status: DC
Start: 2021-03-24 — End: 2021-03-25
  Administered 2021-03-24 – 2021-03-25 (×3): 100 mg via ORAL

## 2021-03-24 MED FILL — OXYCODONE HCL 5 MG PO TABS: 5 MG | ORAL | Qty: 2

## 2021-03-24 MED FILL — BUSPIRONE HCL 10 MG PO TABS: 10 MG | ORAL | Qty: 3

## 2021-03-24 MED FILL — MOVANTIK 12.5 MG PO TABS: 12.5 MG | ORAL | Qty: 1

## 2021-03-24 MED FILL — FUROSEMIDE 40 MG PO TABS: 40 MG | ORAL | Qty: 1

## 2021-03-24 MED FILL — CETIRIZINE HCL 10 MG PO TABS: 10 MG | ORAL | Qty: 1

## 2021-03-24 MED FILL — ASPIR-LOW 81 MG PO TBEC: 81 MG | ORAL | Qty: 1

## 2021-03-24 MED FILL — FOLIC ACID 1 MG PO TABS: 1 MG | ORAL | Qty: 1

## 2021-03-24 MED FILL — STOOL SOFTENER 100 MG PO CAPS: 100 MG | ORAL | Qty: 1

## 2021-03-24 MED FILL — PANTOPRAZOLE SODIUM 40 MG PO TBEC: 40 MG | ORAL | Qty: 1

## 2021-03-24 MED FILL — METOPROLOL SUCCINATE ER 25 MG PO TB24: 25 MG | ORAL | Qty: 1

## 2021-03-24 MED FILL — DILAUDID 1 MG/ML IJ SOLN: 1 MG/ML | INTRAMUSCULAR | Qty: 0.5

## 2021-03-24 MED FILL — LORAZEPAM 1 MG PO TABS: 1 MG | ORAL | Qty: 1

## 2021-03-24 MED FILL — FERROUS GLUCONATE 240 (27 FE) MG PO TABS: 240 (27 Fe) MG | ORAL | Qty: 2

## 2021-03-24 MED FILL — VITAMIN B-12 1000 MCG PO TABS: 1000 MCG | ORAL | Qty: 1

## 2021-03-24 MED FILL — ABILIFY 15 MG PO TABS: 15 MG | ORAL | Qty: 1

## 2021-03-24 NOTE — Progress Notes (Signed)
[facility]  General Surgery - Rudi Rummage, APRN - CNP  On behalf of Dr.  Neoma Laming covering for Dr. 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  PLANS   Analgesics and antiemetics as needed  IV hydration  Regular diet as tolerated  Increase activity  SCDs for DVT prophylaxis  SUBJECTIVE   Roberta Green is doing well. She denies nausea or vomiting, has passed flatus and had a bowel movement. She is tolerating a ADULT DIET; Regular. Her pain is well controlled on current medications. She has been ambulating in the halls.   CURRENT MEDICATIONS   Scheduled Meds:   scopolamine  1 patch TransDERmal Once    ARIPiprazole  15 mg Oral Daily    aspirin  81 mg Oral Daily    busPIRone  30 mg Oral Daily    ferrous gluconate  360 mg Oral Daily with breakfast    folic acid  1 mg Oral Daily    furosemide  40 mg Oral Daily    linaclotide  145 mcg Oral QAM AC    cetirizine  10 mg Oral Daily    LORazepam  1 mg Oral Nightly    metoprolol succinate  25 mg Oral Daily    norgestrel-ethinyl estradiol  1 tablet Oral Daily    pantoprazole  40 mg Oral Daily    vitamin B-12  500 mcg Oral Daily    sodium chloride flush  5-40 mL IntraVENous 2 times per day     Continuous Infusions:   sodium chloride 125 mL/hr at 03/23/21 1815    sodium chloride       PRN Meds:.sodium chloride flush, sodium chloride, ondansetron **OR** ondansetron, oxyCODONE **OR** oxyCODONE, HYDROmorphone **OR** HYDROmorphone  OBJECTIVE   CURRENT VITALS:  height is 5\' 5"  (1.651 m) and weight is 205 lb (93 kg). Her oral temperature is 97.9 ??F (36.6 ??C). Her blood pressure is 115/59 (abnormal) and her pulse is 82. Her  respiration is 16 and oxygen saturation is 94%.  Body mass index is 34.11 kg/m??.  Temperature Range (24h):Temp: 97.9 ??F (36.6 ??C) Temp  Avg: 97.9 ??F (36.6 ??C)  Min: 97 ??F (36.1 ??C)  Max: 98.8 ??F (37.1 ??C)  BP Range (24h): Systolic (24hrs), Avg:116 , Min:108 , Max:126     Diastolic (24hrs), Avg:67, Min:57, Max:76    Pulse Range (24h): Pulse  Avg: 82.3  Min: 64  Max: 93  Respiration Range (24h): Resp  Avg: 13.7  Min: 10  Max: 16  Current Pulse Ox (24h):  SpO2: 94 %  Pulse Ox Range (24h):  SpO2  Avg: 94.3 %  Min: 91 %  Max: 100 %  Oxygen Amount and Delivery: O2 Flow Rate (L/min): 2 L/min  Incentive Spirometry Tx:            GENERAL: alert, no distress  LUNGS: clear to ausculation, without wheezes, rales or rhonci  HEART: normal rate and regular rhythm  ABDOMEN: non-distended, soft, incisional tenderness, bowel sounds present in all 4 quadrants, and no guarding or peritoneal signs  INCISION: healing well, no significant drainage, no significant erythema  EXTREMITY: no cyanosis, clubbing or edema  In: 4580 [P.O.:2780;  I.V.:1800]  Out: 2      Date 03/24/21 0000 - 03/24/21 2359   Shift 0000-0759 1988-2208 1600-2359 24 Hour Total   INTAKE   P.O. 2300 480  2780   I.V.(mL/kg/hr) 1000(1.3)   1000   Shift Total(mL/kg) 3300(35.5) 480(5.2)  3780(40.7)   OUTPUT   Shift Total(mL/kg)       Weight (kg) 93 93 93 93     LABS     Recent Labs     03/23/21  1003 03/24/21  0425   WBC  --  24.5*   HGB  --  13.2   HCT  --  38.7   PLT  --  362   K 4.6  --       No results for input(s): PTT, INR in the last 72 hours.    Invalid input(s): PT  No results for input(s): AST, ALT, BILITOT, BILIDIR, AMYLASE, LIPASE, LDH, LACTA in the last 72 hours.  No results for input(s): TROPONINT in the last 72 hours.      RADIOLOGY     No orders to display       Electronically signed by Rudi Rummage, APRN - CNP on 03/24/2021 at 12:25 PM    Patient seen with Katina Degree on March 24, 2021.  Patient hemodynamically stable.  Never had diet ordered has not eaten.   Requiring parenteral narcotics.  Postop white count 24,000 history of splenectomy.  No concerns for signs of infection at this point.  Recheck in AM.  Discharge pending pain control and tolerance of diet in AM.

## 2021-03-24 NOTE — Anesthesia Post-Procedure Evaluation (Signed)
Department of Anesthesiology  Postprocedure Note    Patient: Roberta Green  MRN: [id number]  Birthdate: 12-Jan-1991  Date of evaluation: 03/24/2021      Procedure Summary     Date: 03/23/21 Room / Location: [facility] OR 07 / [facility]    Anesthesia Start: 1224 Anesthesia Stop: 1415    Procedure: Robotic Assisted Laparoscopy, Extensive Lysis of Adhesion, Repair of Insional Hernia with Mesh (Abdomen) Diagnosis:       Abdominal pain, unspecified abdominal location      (Abdominal pain, unspecified abdominal location [R10.9])    Surgeons: Lequita Halt, MD Responsible Provider: Eloy End, DO    Anesthesia Type: general ASA Status: 2          Anesthesia Type: No value filed.    Aldrete Phase I: Aldrete Score: 9    Aldrete Phase II: Aldrete Score: 10      Anesthesia Post Evaluation    Patient location during evaluation: PACU  Patient participation: complete - patient participated  Level of consciousness: awake and alert  Airway patency: patent  Nausea & Vomiting: no vomiting and no nausea  Complications: no  Cardiovascular status: hemodynamically stable  Respiratory status: acceptable  Hydration status: stable

## 2021-03-24 NOTE — Plan of Care (Signed)
Problem: Pain  Goal: Verbalizes/displays adequate comfort level or baseline comfort level  Outcome: Progressing

## 2021-03-24 NOTE — Plan of Care (Signed)
Problem: Discharge Planning  Goal: Discharge to home or other facility with appropriate resources  Outcome: Progressing     Problem: Pain  Goal: Verbalizes/displays adequate comfort level or baseline comfort level  Outcome: Progressing     Problem: ABCDS Injury Assessment  Goal: Absence of physical injury  Outcome: Progressing

## 2021-03-25 LAB — CBC
Hematocrit: 39.4 % (ref 37.0–47.0)
Hemoglobin: 13.2 gm/dl (ref 12.0–16.0)
MCH: 31.1 pg (ref 26.0–33.0)
MCHC: 33.5 gm/dl (ref 32.2–35.5)
MCV: 92.7 fL (ref 81.0–99.0)
MPV: 10.5 fL (ref 9.4–12.4)
Platelets: 359 10*3/uL (ref 130–400)
RBC: 4.25 10*6/uL (ref 4.20–5.40)
RDW-CV: 14.5 % (ref 11.5–14.5)
RDW-SD: 49.1 fL — ABNORMAL HIGH (ref 35.0–45.0)
WBC: 17.4 10*3/uL — ABNORMAL HIGH (ref 4.8–10.8)

## 2021-03-25 MED ORDER — POLYETHYLENE GLYCOL 3350 17 G PO PACK
17 g | Freq: Once | ORAL | Status: AC
Start: 2021-03-25 — End: 2021-03-25
  Administered 2021-03-25: 14:00:00 17 g via ORAL

## 2021-03-25 MED ORDER — OXYCODONE HCL 5 MG PO TABS
5 MG | ORAL_TABLET | Freq: Four times a day (QID) | ORAL | 0 refills | Status: DC | PRN
Start: 2021-03-25 — End: 2021-04-02

## 2021-03-25 MED FILL — FOLIC ACID 1 MG PO TABS: 1 MG | ORAL | Qty: 1

## 2021-03-25 MED FILL — LORAZEPAM 1 MG PO TABS: 1 MG | ORAL | Qty: 1

## 2021-03-25 MED FILL — DILAUDID 1 MG/ML IJ SOLN: 1 MG/ML | INTRAMUSCULAR | Qty: 0.5

## 2021-03-25 MED FILL — BUSPIRONE HCL 10 MG PO TABS: 10 MG | ORAL | Qty: 3

## 2021-03-25 MED FILL — CETIRIZINE HCL 10 MG PO TABS: 10 MG | ORAL | Qty: 1

## 2021-03-25 MED FILL — POLYETHYLENE GLYCOL 3350 17 G PO PACK: 17 g | ORAL | Qty: 1

## 2021-03-25 MED FILL — VITAMIN B-12 1000 MCG PO TABS: 1000 MCG | ORAL | Qty: 1

## 2021-03-25 MED FILL — OXYCODONE HCL 5 MG PO TABS: 5 MG | ORAL | Qty: 2

## 2021-03-25 MED FILL — ASPIR-LOW 81 MG PO TBEC: 81 MG | ORAL | Qty: 1

## 2021-03-25 MED FILL — ABILIFY 15 MG PO TABS: 15 MG | ORAL | Qty: 1

## 2021-03-25 MED FILL — STOOL SOFTENER 100 MG PO CAPS: 100 MG | ORAL | Qty: 1

## 2021-03-25 MED FILL — FERROUS GLUCONATE 240 (27 FE) MG PO TABS: 240 (27 Fe) MG | ORAL | Qty: 2

## 2021-03-25 MED FILL — PANTOPRAZOLE SODIUM 40 MG PO TBEC: 40 MG | ORAL | Qty: 1

## 2021-03-25 NOTE — Progress Notes (Incomplete)
Introduced self to patient. Alert and Orientated X4. VS Obtains. Head to Toe assessment performed. Three Incisions on the left side of abdomen w/ steri strips. No sign of infection. Skin is warm, dry and tan.Pupils react to light 85mm-5mm. Lungs clear throughout. Bowel sounds active in all 4 quadrants.

## 2021-03-25 NOTE — Discharge Instructions (Addendum)
DR. Tillman Sers DISCHARGE INSTRUCTIONS    Pt Name: Roberta Green  Medical Record Number: [id number]  Today's Date: 03/25/2021  GENERAL ANESTHESIA OR SEDATION   1. Do not drive or operate hazardous machinery for 24 hours.  2. Do not make important business or personal decisions for 24 hours.  3. Do not drink alcoholic beverages or use tobacco for 24 hours.  ACTIVITY INSTRUCTIONS   Rest today. Resume light to normal activity tomorrow.  You may resume normal activity tomorrow. Do not engage in strenuous activity that may place stress on your incision.  Do not drive for 3-5 days and avoid heavy lifting, tugging, pullings greater than 10-20 lbs until seen in the office.    DIET INSTRUCTIONS   Begin with clear liquids. If not nauseated, may increase to a low-fat diet when you desire. Greasy and spicy foods are not advised.  Regular diet as tolerated.  MEDICATIONS   Prescription sent with you for Percocet, Take as directed.  Do not drink alcohol or drive while taking pain medications. You may experience dizziness or drowsiness with these medications. You may also experience constipation which can be relieved with stool softners or laxatives.  You may resume your daily prescription medication schedule unless otherwise specified.  Do not take 325mg  Aspirin or other blood thinners such as Coumadin or Plavix for 5 days.  WOUND & DRESSING INSTRUCTIONS   Always ensure you and your care giver clean hands before and after caring for the wound.  Keep dressing clean and dry for 48 hours. Change when soiled or wet.      Allow steri-strips to fall off on their own.   Ice operative site for 20 minutes 4 times a day.     May wash over incision in shower in 48 hours, but do not soak in a bath.  Take sitz bath for 20 minutes twice daily and after bowel movements.  Keep the abdominal binder in place during the day. May remove to shower and at night.  Remove packing from wound in 24 hours and replace with AMD dressings daily.  Closed  suction drain care. Every 8 hours open the stopper on the drain reservoir and allow it to completely inflate. Measure the amount of drainage in the reservoir and write it down, along with the date and time you did the measurement. Empty the reservoir into the sink or toilet. Squeeze the drain reservoir to remove the air inside, then replace the stopper so that when you let go of the reservoir, it wants to expand. It is then on suction. Do not allow the drain to expand completely.  If it is completely expanded, it is NOT suctioning.  BREAST PROCEDURES   Following breast procedures it is important to use supportive garments  It is also normal with sentinel lymph node biopsy for the urine to have a bluish color.  ABDOMINAL & LAPAROSCOPIC PROCEDURES   1. You are encouraged to get up and move around as this helps with the circulation and speeds up the healing process.  2. Breath deeply and cough from time to time. This helps to clear your lungs and helps prevent pneumonia.  3. Supporting your incision with a pillow or your hand helps to minimize discomfort and pain.  4. Laparoscopic patients may develop shoulder pain in the first 48 hours from the gas used during the procedure.  FOLLOW UP CARE, SPECIFICALLY WATCH FOR:    Fever over 101 degrees by mouth   Increased redness, warmth, hardness at  operative site.   Blood soaked dressing (small amounts of oozing may be normal.)   Increased or progressive drainage from the surgical area   Inability to urinate or blood in the urine   Pain not relieved by the medications ordered   Persistent nausea and/or vomiting, unable to retain fluids.    FOLLOW-UP APPOINTMENT in 2 weeks    Call my office if you have any problem that concerns you [phone]. After hours, you can reach the answering service via the office phone number. IF YOU NEED IMMEDIATE ATTENTION, GO TO THE EMERGENCY ROOM AND YOUR DOCTOR WILL BE CONTACTED.    [name], M.D.  [address]  [place], [place]  [address]  Electronically signed by Florian Buff, MD on 03/25/2021 at 7:29 AM

## 2021-03-25 NOTE — Plan of Care (Incomplete)
Problem: Discharge Planning  Goal: Discharge to home or other facility with appropriate resources  Outcome: Completed  Flowsheets (Taken 03/25/2021 0845)  Discharge to home or other facility with appropriate resources:   Identify barriers to discharge with patient and caregiver   Arrange for needed discharge resources and transportation as appropriate   Identify discharge learning needs (meds, wound care, etc)   Refer to discharge planning if patient needs post-hospital services based on physician order or complex needs related to functional status, cognitive ability or social support system     Problem: Pain  Goal: Verbalizes/displays adequate comfort level or baseline comfort level  Outcome: Completed     Problem: ABCDS Injury Assessment  Goal: Absence of physical injury  Outcome: Completed     Problem: Skin/Tissue Integrity  Goal: Absence of new skin breakdown  Description: 1.  Monitor for areas of redness and/or skin breakdown  2.  Assess vascular access sites hourly  3.  Every 4-6 hours minimum:  Change oxygen saturation probe site  4.  Every 4-6 hours:  If on nasal continuous positive airway pressure, respiratory therapy assess nares and determine need for appliance change or resting period.  Outcome: Completed   Discharge instructions given and reviewed with pt and mother.

## 2021-03-25 NOTE — Discharge Summary (Signed)
Discharge Summary     Patient Identification:  Roberta Green  DOB: [birth year]  MRN: [id number]   Account: [id number]     Admit date: 03/23/2021  Discharge date: 03/25/21   Attending provider: No att. providers found        Primary care provider: Whitney Muse     Discharge Diagnoses:   Principal Problem:    Abdominal pain, unspecified abdominal location  Active Problems:    Peritoneal adhesions    Ventral hernia without obstruction or gangrene  Resolved Problems:    * No resolved hospital problems. *       Hospital Course:   Roberta Green is a 30 y.o. female admitted to [name] on 03/23/2021 for ventral hernia repair.   she was taken to the operative suite per [name] MD and the planned procedure was performed as noted above.  She was admitted to [facility] for postoperative care and was initially managed with analgesics for pain control, IV fluid hydration, GI and DVT prophylaxis. Over the hospital stay she readily improved in ability to tolerate increasing levels of activity and to take po fluids, solid foods with evidence of returning bowel function, and spontaneously voiding. At the time of discharge she was able to tolerate pain with oral analgesic and was medically stable.      Procedures:   DATE OF PROCEDURE:  03/23/2021     PREOPERATIVE DIAGNOSES:  Abdominal pain, probable adhesions.     POSTOPERATIVE DIAGNOSES:  Adhesions with ventral/incisional hernia.     OPERATIONS:  1.  Robotic-assisted lysis of extensive adhesions.  2.  Ventral hernia repair with 4.5-inch Echo PS Ventralight mesh.     SURGEON:  Jim Desanctis. Hixenbaugh, MD     ANESTHESIA:  General.     COMPLICATIONS:  None.  Blood Loss 30 mL  INDICATION FOR PROCEDURE:  The patient is a 30 year old white female who  has had a previous distal pancreatectomy, splenectomy, ventral hernia  repair, cholecystectomy; who was having significant and ongoing  periumbilical pain.  It was felt that adhesions were likely and she is  here for same.      FINDINGS:  The patient did have mostly omental adhesions up to the  peritoneum.  There were some loops of bowel but, otherwise, mostly fat.   The patient had a previous mesh identified.  The patient, however, at  the umbilicus had another hernia.  It was repaired with a 4.5-inch Echo  PS mesh.     DESCRIPTION OF PROCEDURE:  The patient was brought into the operating  suite, placed supine on the operating room table.  After adequate  inhalational anesthesia was administered, the patient's abdomen was  prepped and draped in the usual sterile fashion.  Going right below the  umbilicus, we inserted a Veress needle and CO2 was insufflated to a  pressure of 15.  Veress needle was removed and then used an OptiView  trocar to gain access to the abdominal cavity in the left upper  quadrant.  As we put the camera in after we removed the OptiView port,  there were adhesions right there starting on the left peritoneal wall.   We put the other two robotic ports in under direct visualization.  I  took my place at the module and the robot was brought in and docked to  the ports.  We then began the lysis of extensive adhesions of omentum  and fatty tissue and as we worked towards the falciform ligament, there  were some loops of bowel that were attached out.  We were easily able to  free those up without apparent injury.  We then could see the old mesh  that had been placed, it was clearly in the upper abdomen; and then as  we worked towards the umbilicus freeing up adhesions, it was clear that  there was a ventral hernia at the umbilicus.  We freed up all of the  adhesions so that the peritoneum was free.  We then inserted a 0  Stratafix suture and closed the defect and inserted an Echo PS  Ventralight mesh 4.5 inch in the abdominal cavity.  The grasper was  brought through the closed defect.  The catheter for the exoskeleton of  the mesh was brought out to the outside and it was insufflated and the  mesh was brought up right to  the peritoneal wall.  We then sutured the  mesh to the peritoneum with a running 0-PDS suture.  The two needles  were removed.  The exoskeleton was removed off.  The mesh was removed,  and we did take pictures throughout this process.  All the adhesions  were lysed.  There were no other abnormalities noted and then closure  was begun.  The trocars were removed as air was aspirated out of the  abdominal cavity.  Interrupted 4-0 Vicryl was used to close the skin.   Steri-Strips were applied.       Code Status: Prior     Consults:   none    Examination:  Vitals:  Vitals:    03/24/21 1633 03/24/21 1942 03/25/21 0300 03/25/21 0845   BP: 119/61 127/74 (!) 112/59 114/65   Pulse: 78 76 82 79   Resp: 19 18  18    Temp: 98.6 ??F (37 ??C) 98.8 ??F (37.1 ??C) 98.5 ??F (36.9 ??C) 98.5 ??F (36.9 ??C)   TempSrc: Oral Oral Oral Oral   SpO2: 96%   97%   Weight:       Height:         Weight: Weight: 205 lb (93 kg)     24 hour intake/output:No intake or output data in the 24 hours ending 04/02/21 1423      Significant Diagnostics:   Radiology: CT COMPARISON OF OUTSIDE FILMS    Result Date: 03/15/2021  Radiology exam is complete. No Radiologist dictation. Please follow up with ordering provider.       Labs: No results found for this or any previous visit (from the past 72 hour(s)).    Patient Instructions:    DR. 03/17/2021 DISCHARGE INSTRUCTIONS    Pt Name: Roberta Green  Medical Record Number: Hilary Hertz  Today's Date: 03/25/2021  GENERAL ANESTHESIA OR SEDATION   1. Do not drive or operate hazardous machinery for 24 hours.  2. Do not make important business or personal decisions for 24 hours.  3. Do not drink alcoholic beverages or use tobacco for 24 hours.  ACTIVITY INSTRUCTIONS   Rest today. Resume light to normal activity tomorrow.  You may resume normal activity tomorrow. Do not engage in strenuous activity that may place stress on your incision.  Do not drive for 3-5 days and avoid heavy lifting, tugging, pullings greater than 10-20 lbs  until seen in the office.    DIET INSTRUCTIONS   Begin with clear liquids. If not nauseated, may increase to a low-fat diet when you desire. Greasy and spicy foods are not advised.  Regular diet as tolerated.  MEDICATIONS  Prescription sent with you for Percocet, Take as directed.  Do not drink alcohol or drive while taking pain medications. You may experience dizziness or drowsiness with these medications. You may also experience constipation which can be relieved with stool softners or laxatives.  You may resume your daily prescription medication schedule unless otherwise specified.  Do not take 325mg  Aspirin or other blood thinners such as Coumadin or Plavix for 5 days.  WOUND & DRESSING INSTRUCTIONS   Always ensure you and your care giver clean hands before and after caring for the wound.  Keep dressing clean and dry for 48 hours. Change when soiled or wet.      Allow steri-strips to fall off on their own.   Ice operative site for 20 minutes 4 times a day.     May wash over incision in shower in 48 hours, but do not soak in a bath.  Take sitz bath for 20 minutes twice daily and after bowel movements.  Keep the abdominal binder in place during the day. May remove to shower and at night.  Remove packing from wound in 24 hours and replace with AMD dressings daily.  Closed suction drain care. Every 8 hours open the stopper on the drain reservoir and allow it to completely inflate. Measure the amount of drainage in the reservoir and write it down, along with the date and time you did the measurement. Empty the reservoir into the sink or toilet. Squeeze the drain reservoir to remove the air inside, then replace the stopper so that when you let go of the reservoir, it wants to expand. It is then on suction. Do not allow the drain to expand completely.  If it is completely expanded, it is NOT suctioning.  BREAST PROCEDURES   Following breast procedures it is important to use supportive garments  It is also normal with  sentinel lymph node biopsy for the urine to have a bluish color.  ABDOMINAL & LAPAROSCOPIC PROCEDURES   1. You are encouraged to get up and move around as this helps with the circulation and speeds up the healing process.  2. Breath deeply and cough from time to time. This helps to clear your lungs and helps prevent pneumonia.  3. Supporting your incision with a pillow or your hand helps to minimize discomfort and pain.  4. Laparoscopic patients may develop shoulder pain in the first 48 hours from the gas used during the procedure.  FOLLOW UP CARE, SPECIFICALLY WATCH FOR:    Fever over 101 degrees by mouth   Increased redness, warmth, hardness at operative site.   Blood soaked dressing (small amounts of oozing may be normal.)   Increased or progressive drainage from the surgical area   Inability to urinate or blood in the urine   Pain not relieved by the medications ordered   Persistent nausea and/or vomiting, unable to retain fluids.    FOLLOW-UP APPOINTMENT in 2 weeks    Call my office if you have any problem that concerns you [phone]. After hours, you can reach the answering service via the office phone number. IF YOU NEED IMMEDIATE ATTENTION, GO TO THE EMERGENCY ROOM AND YOUR DOCTOR WILL BE CONTACTED.    [phone], M.D.  [address]  [place], [place]  Electronically signed by [id number], MD on 03/25/2021 at 7:29 AM   Diet: No diet orders on file      Follow-up visits:   No follow-up provider specified.  Discharge condition: fair  Disposition: Home  Time spent on discharge: 35 minutes     Discharge Medications:     Medication List        CONTINUE taking these medications      ARIPiprazole 15 MG tablet  Commonly known as: ABILIFY     aspirin 81 MG EC tablet     busPIRone 30 MG tablet  Commonly known as: BUSPAR     ferrous gluconate 324 (38 Fe) MG tablet  Commonly known as: FERGON     folic acid 1 MG tablet  Commonly known as: FOLVITE     furosemide 40 MG tablet  Commonly known  as: LASIX     linaclotide 145 MCG capsule  Commonly known as: LINZESS     loratadine 10 MG tablet  Commonly known as: CLARITIN     LORazepam 1 MG tablet  Commonly known as: ATIVAN     Low-Ogestrel 0.3-30 MG-MCG per tablet  Generic drug: norgestrel-ethinyl estradiol     metoprolol succinate 25 MG extended release tablet  Commonly known as: TOPROL XL     ondansetron 8 MG tablet  Commonly known as: ZOFRAN     PROTONIX PO     vitamin B-12 500 MCG tablet  Commonly known as: CYANOCOBALAMIN            Discharge Summary completed on behalf of Dr Pat Patrick   Electronically signed by Kathrine Haddock, APRN - CNP on 04/02/2021 at 2:23 PM

## 2021-03-25 NOTE — Progress Notes (Addendum)
[facility]  General Surgery - Florian Buff, MD  Covering for Dr. Pat Patrick  Postoperative Progress Note  Pt Name: Roberta Green  Medical Record Number: [id number]  Date of Birth 27-Sep-1990   Today's Date: 03/25/2021  ASSESSMENT   POD # 2robotic-assisted lysis of extensive adhesions, ventral hernia repair with mesh    PLANS   Order CBC for recheck for today if improved planning discharge addendum hemoglobin is down to 17 we will discharge  analgesics and antiemetics as needed  IV hydration  Regular diet as tolerated  Increase activity  SCDs for DVT prophylaxis  SUBJECTIVE   Roberta Green is doing well. She denies nausea or vomiting, has passed flatus and had a bowel movement. She is tolerating a ADULT DIET; Regular. Her pain is well controlled on current medications. She has been ambulating in the halls.   CURRENT MEDICATIONS   Scheduled Meds:   naloxegol  12.5 mg Oral QAM AC    docusate sodium  100 mg Oral BID    scopolamine  1 patch TransDERmal Once    ARIPiprazole  15 mg Oral Daily    aspirin  81 mg Oral Daily    busPIRone  30 mg Oral Daily    ferrous gluconate  360 mg Oral Daily with breakfast    folic acid  1 mg Oral Daily    furosemide  40 mg Oral Daily    linaclotide  145 mcg Oral QAM AC    cetirizine  10 mg Oral Daily    LORazepam  1 mg Oral Nightly    metoprolol succinate  25 mg Oral Daily    norgestrel-ethinyl estradiol  1 tablet Oral Daily    pantoprazole  40 mg Oral Daily    vitamin B-12  500 mcg Oral Daily    sodium chloride flush  5-40 mL IntraVENous 2 times per day     Continuous Infusions:   sodium chloride 125 mL/hr at 03/24/21 1428    sodium chloride       PRN Meds:.sodium chloride flush, sodium chloride, ondansetron **OR** ondansetron, oxyCODONE **OR** oxyCODONE, HYDROmorphone **OR** HYDROmorphone  OBJECTIVE   CURRENT VITALS:  height is 5\' 5"  (1.651 m) and weight is 205 lb (93 kg). Her oral temperature is 98.5 ??F (36.9 ??C). Her blood pressure is 112/59 (abnormal) and her pulse is 82. Her  respiration is 18 and oxygen saturation is 96%.  Body mass index is 34.11 kg/m??.  Temperature Range (24h):Temp: 98.5 ??F (36.9 ??C) Temp  Avg: 98.5 ??F (36.9 ??C)  Min: 97.9 ??F (36.6 ??C)  Max: 98.8 ??F (37.1 ??C)  BP Range (24h): Systolic (24hrs), Avg:118 , Min:112 , Max:127     Diastolic (24hrs), Avg:63, Min:59, Max:74    Pulse Range (24h): Pulse  Avg: 79.5  Min: 76  Max: 82  Respiration Range (24h): Resp  Avg: 16.6  Min: 16  Max: 19  Current Pulse Ox (24h):  SpO2: 96 %  Pulse Ox Range (24h):  SpO2  Avg: 95 %  Min: 94 %  Max: 96 %  Oxygen Amount and Delivery: O2 Flow Rate (L/min): 2 L/min  Incentive Spirometry Tx:            GENERAL: alert, no distress  LUNGS: clear to ausculation, without wheezes, rales or rhonci  HEART: normal rate and regular rhythm  ABDOMEN: non-distended, soft, incisional tenderness, bowel sounds present in all 4 quadrants, and no guarding or peritoneal signs  INCISION: healing well, no significant drainage, no significant erythema  EXTREMITY: no cyanosis, clubbing or edema  In: 480 [P.O.:480]  Out: 0          LABS     Recent Labs     03/23/21  1003 03/24/21  0425   WBC  --  24.5*   HGB  --  13.2   HCT  --  38.7   PLT  --  362   K 4.6  --       No results for input(s): PTT, INR in the last 72 hours.    Invalid input(s): PT  No results for input(s): AST, ALT, BILITOT, BILIDIR, AMYLASE, LIPASE, LDH, LACTA in the last 72 hours.  No results for input(s): TROPONINT in the last 72 hours.      RADIOLOGY     No orders to display       Electronically signed by Florian Buff, MD on 03/25/2021 at 7:25 AM

## 2021-03-27 ENCOUNTER — Telehealth

## 2021-03-27 MED ORDER — PROMETHAZINE HCL 12.5 MG PO TABS
12.5 MG | ORAL_TABLET | Freq: Four times a day (QID) | ORAL | 0 refills | Status: AC | PRN
Start: 2021-03-27 — End: ?

## 2021-03-27 NOTE — Telephone Encounter (Signed)
S/p ventral hernia repair-03/23/2021 has been taking Zofran and her Protonix-shortness of breath with inhalation. Requesting phenergan. Ok to send to pharmacy patient aware and to increase protonix to twice daily and to go to ER for any increased shortness of breath of issues.

## 2021-04-02 ENCOUNTER — Encounter

## 2021-04-02 MED ORDER — OXYCODONE HCL 5 MG PO TABS
5 MG | ORAL_TABLET | Freq: Four times a day (QID) | ORAL | 0 refills | Status: AC | PRN
Start: 2021-04-02 — End: 2021-04-09

## 2021-04-02 NOTE — Telephone Encounter (Signed)
Patient aware.

## 2021-04-02 NOTE — Telephone Encounter (Signed)
S/p ventral hernia repair  with lysis of ahesions done on 03/23/2021 patient c/o pain in upper incision area that is throbbing would like a refill of her pain meds if possible.

## 2021-04-10 ENCOUNTER — Ambulatory Visit: Payer: MEDICAID | Attending: Physician Assistant | Primary: Family

## 2021-04-10 ENCOUNTER — Encounter: Payer: BLUE CROSS/BLUE SHIELD | Attending: Family | Primary: Family

## 2021-04-10 ENCOUNTER — Ambulatory Visit: Payer: MEDICAID | Primary: Family

## 2021-04-10 NOTE — Progress Notes (Signed)
[facility]  [address], [address]  [place] [address]  Dept: [phone]  Dept Fax: [phone]  Loc: [phone]      The patient did not show to her scheduled office visit today. Our office staff will attempt to contact the patient and reschedule.     Electronically signed by   Gaspar Skeeters, PA-C on 04/10/2021 at 11:56 AM

## 2021-04-10 NOTE — Progress Notes (Signed)
No show

## 2021-04-10 NOTE — Telephone Encounter (Signed)
I CALLED AND LEFT A MESSAGE TO RESCHEDULE.
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
## 2021-07-10 IMAGING — US US abdomen limited
1 series · 14 of 16 positions shown · non-contrast
Comparison: CT abdomen pelvis from 04/26/2021.

Procedure(s): US abdomen limited
PROCEDURE:
US RIGHT UPPER QUAD LIVER GB PANC
HISTORY: Upper quadrant/epigastric abdominal pain, elevated liver enzymes.
TECHNIQUE: Static images from a right upper quadrant ultrasound are submitted.

[Series 1: us abdomen limited · 14 of 38 slices shown]
[im 1/38]
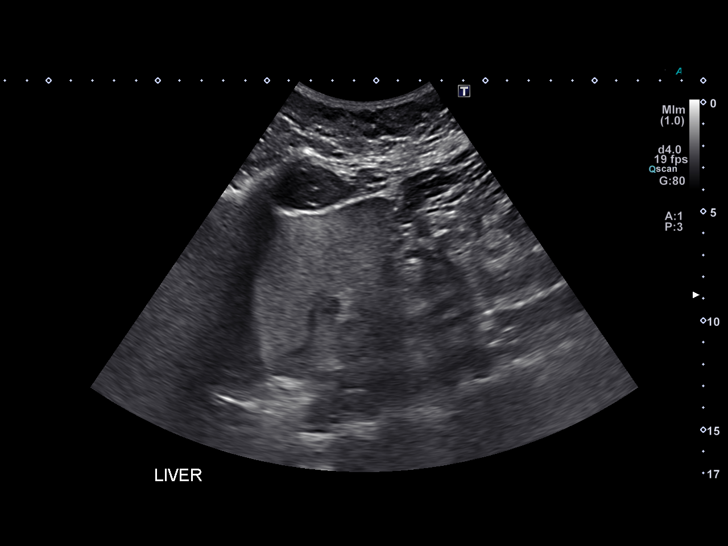
[im 3/38]
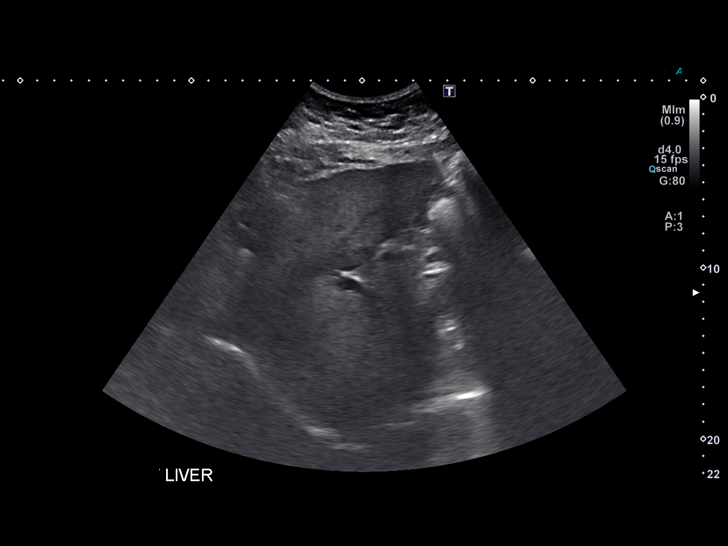
[im 5/38]
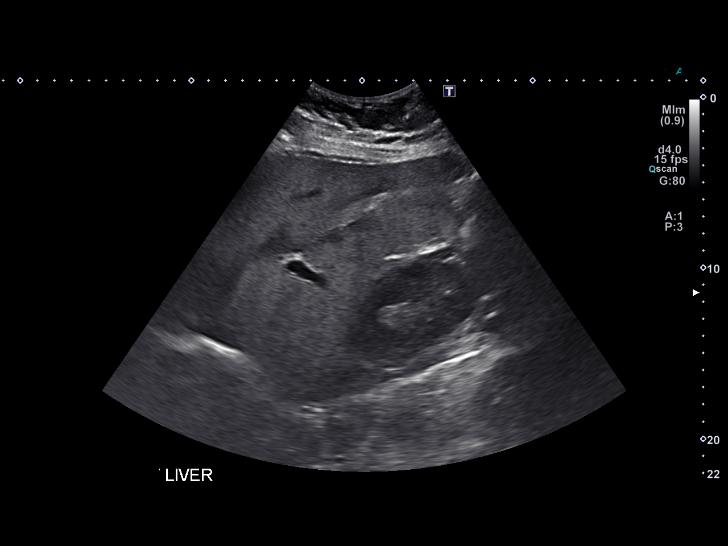
[im 10/38]
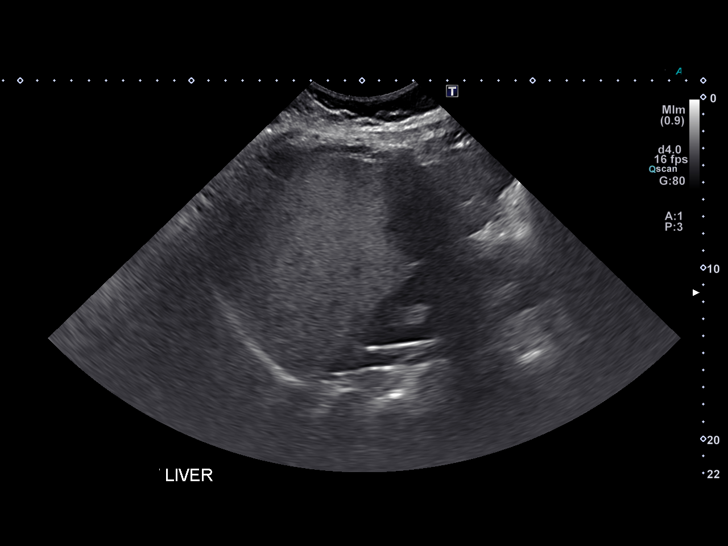
[im 13/38]
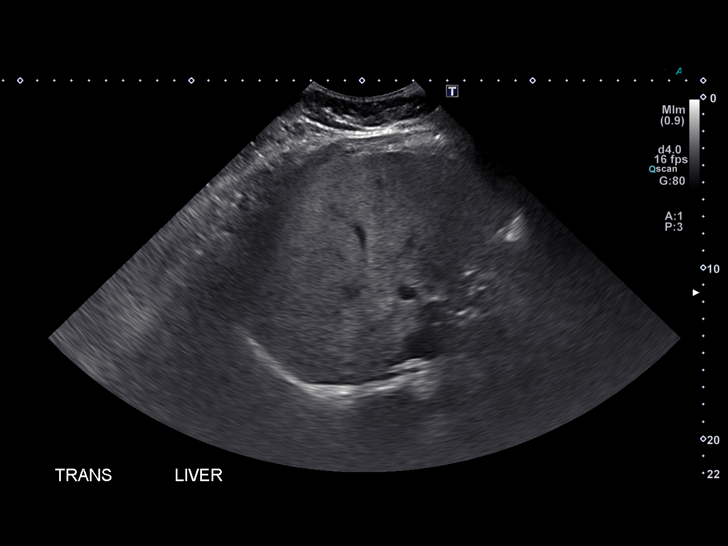
[im 15/38]
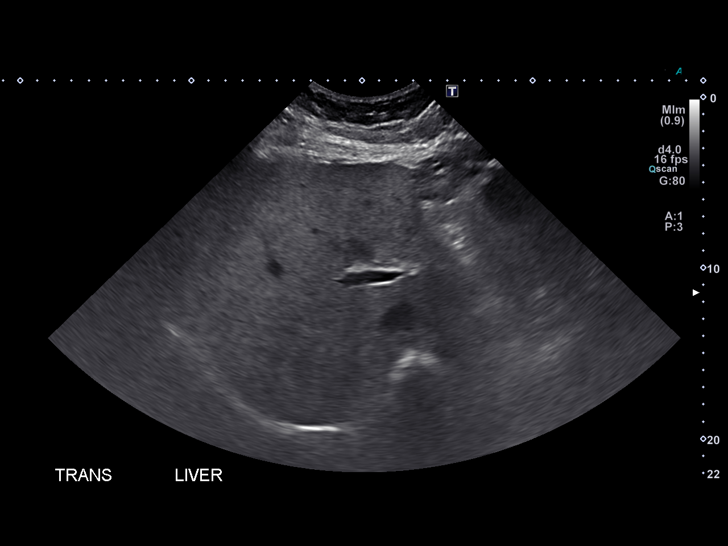
[im 18/38]
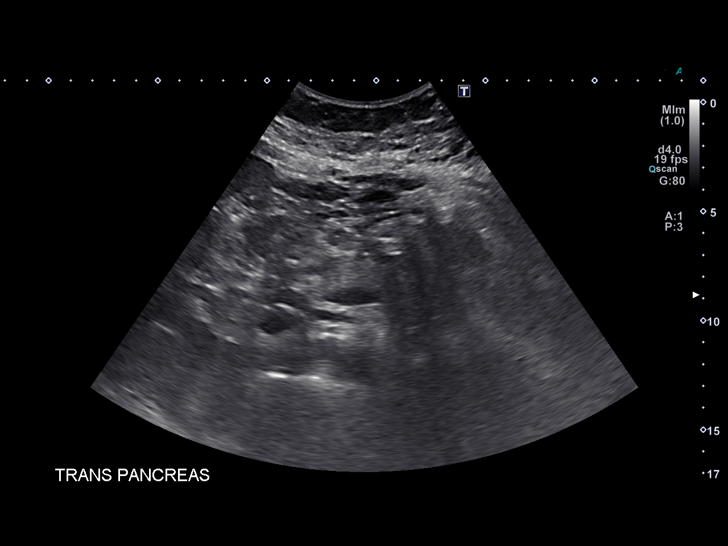
[im 20/38]
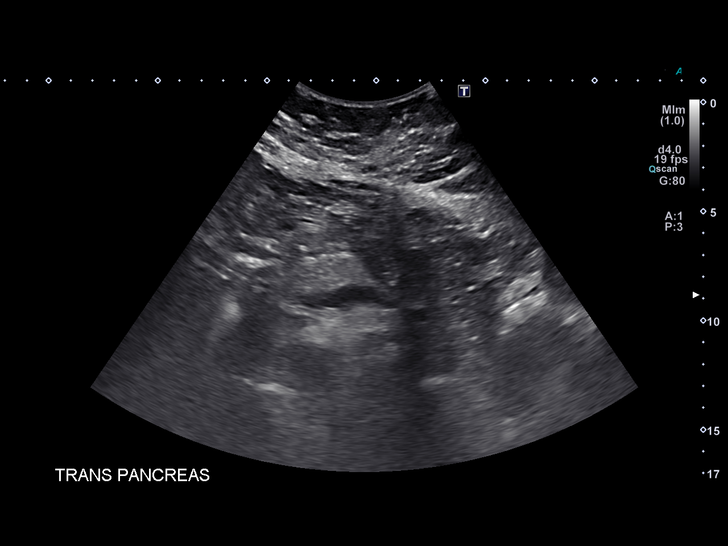
[im 23/38]
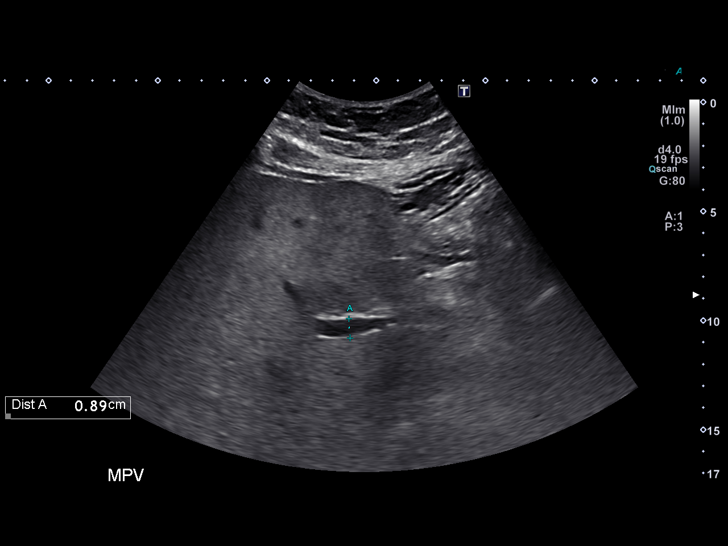
[im 25/38]
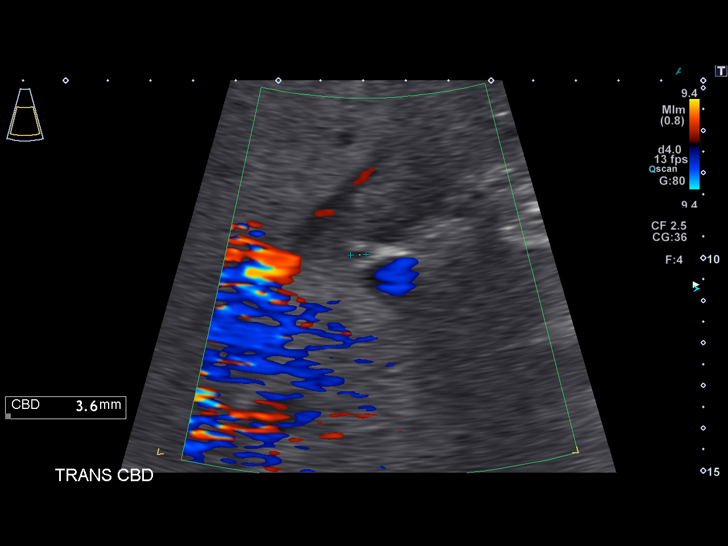
[im 30/38]
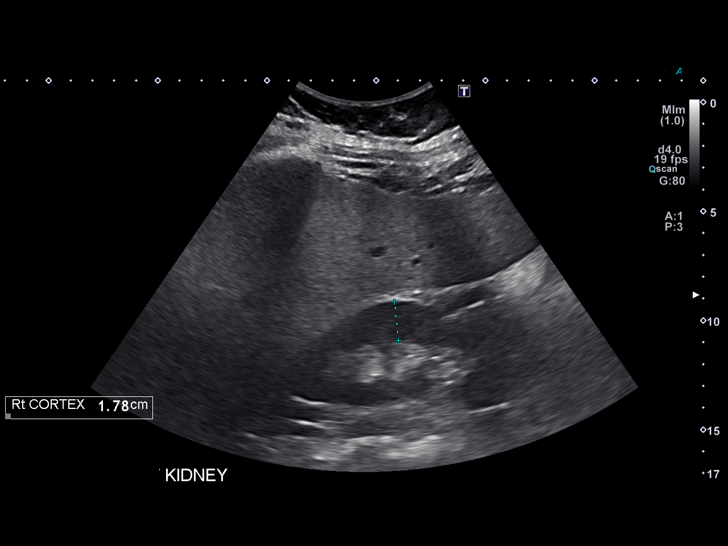
[im 33/38]
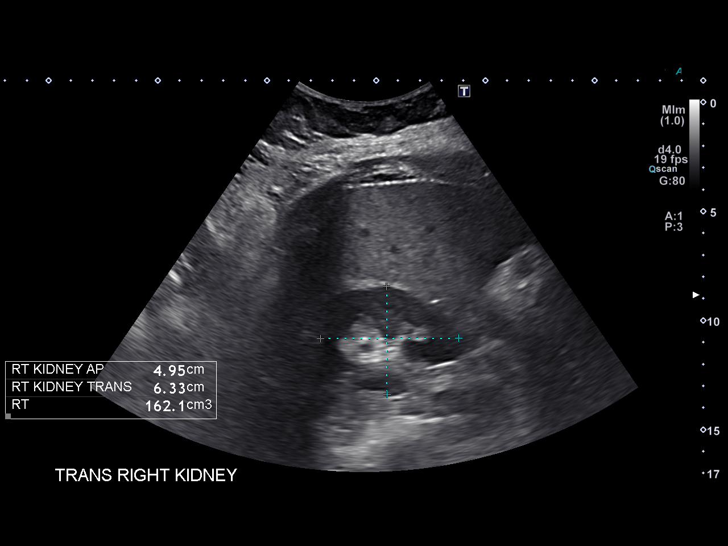
[im 35/38]
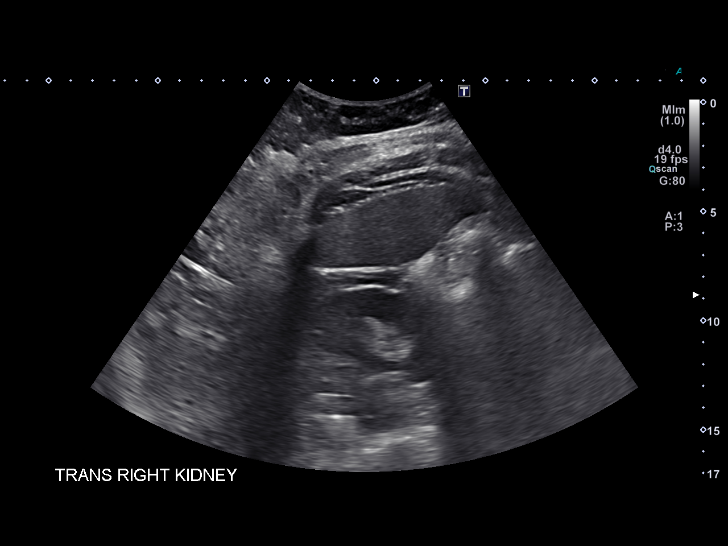
[im 38/38]
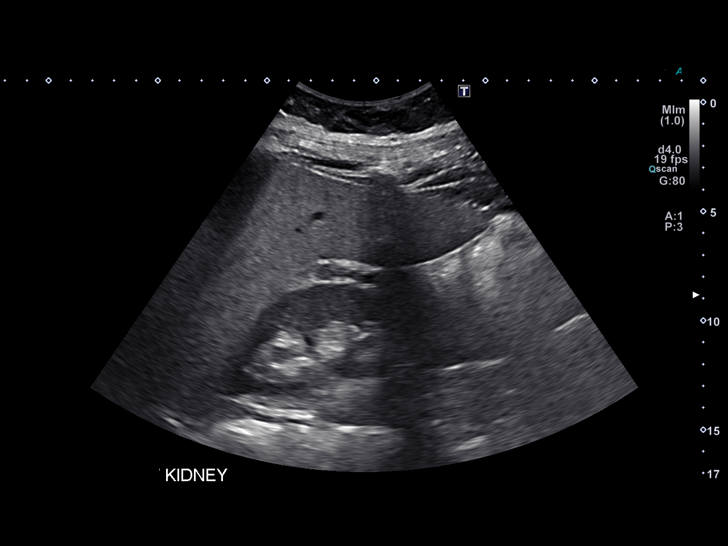

[14 of 16 positions shown; findings below may reference images not displayed]

FINDINGS: There is questionable mild diffuse fatty infiltration of the enlarged liver measuring up to 21.5 cm
in craniocaudad dimension.  There is no focal liver lesion.
There is cholecystectomy changes.
There is limited visualization of the pancreas due to overlying bowel gas, however, there is no
gross abnormality in this region correlating with CT, however, there is administration of distal
pancreatectomy on CT.
The caliber of the main portal vein is within normal limits demonstrating normal flow and direction
by color Doppler.  There is no biliary dilatation.  The common bile duct measures 5 mm in maximum
diameter.
The right kidney is unremarkable measuring 10 cm in longest dimension.
There is no right upper quadrant free fluid.
IMPRESSION: 1. Questionable mild diffuse fatty infiltration of the enlarged liver with no focal lesion.
2.  Stable cholecystectomy changes.

## 2021-07-19 IMAGING — DX XR knee 3V RT
3 series · 3 of 3 positions shown · non-contrast
Comparison: Not available

Procedure(s): XR knee 3V RT
PROCEDURE:
XR KNEE JT 3 VIEW Right
HISTORY: Right knee pain; hit knee on desk
TECHNIQUE: 3 views of the right knee.

[AP (1 of 2)]
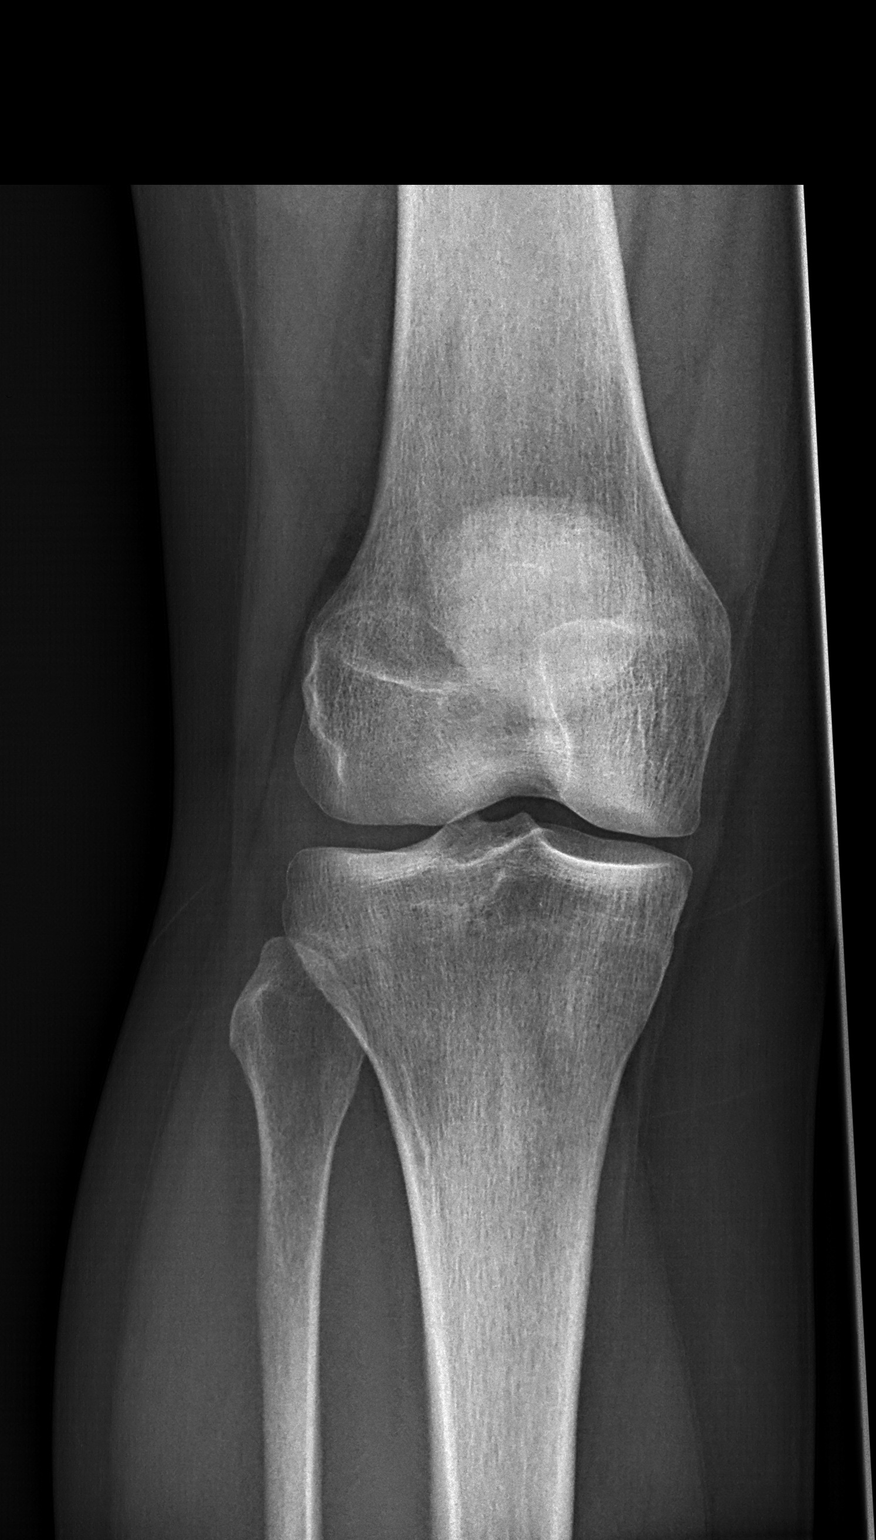

[AP (2 of 2)]
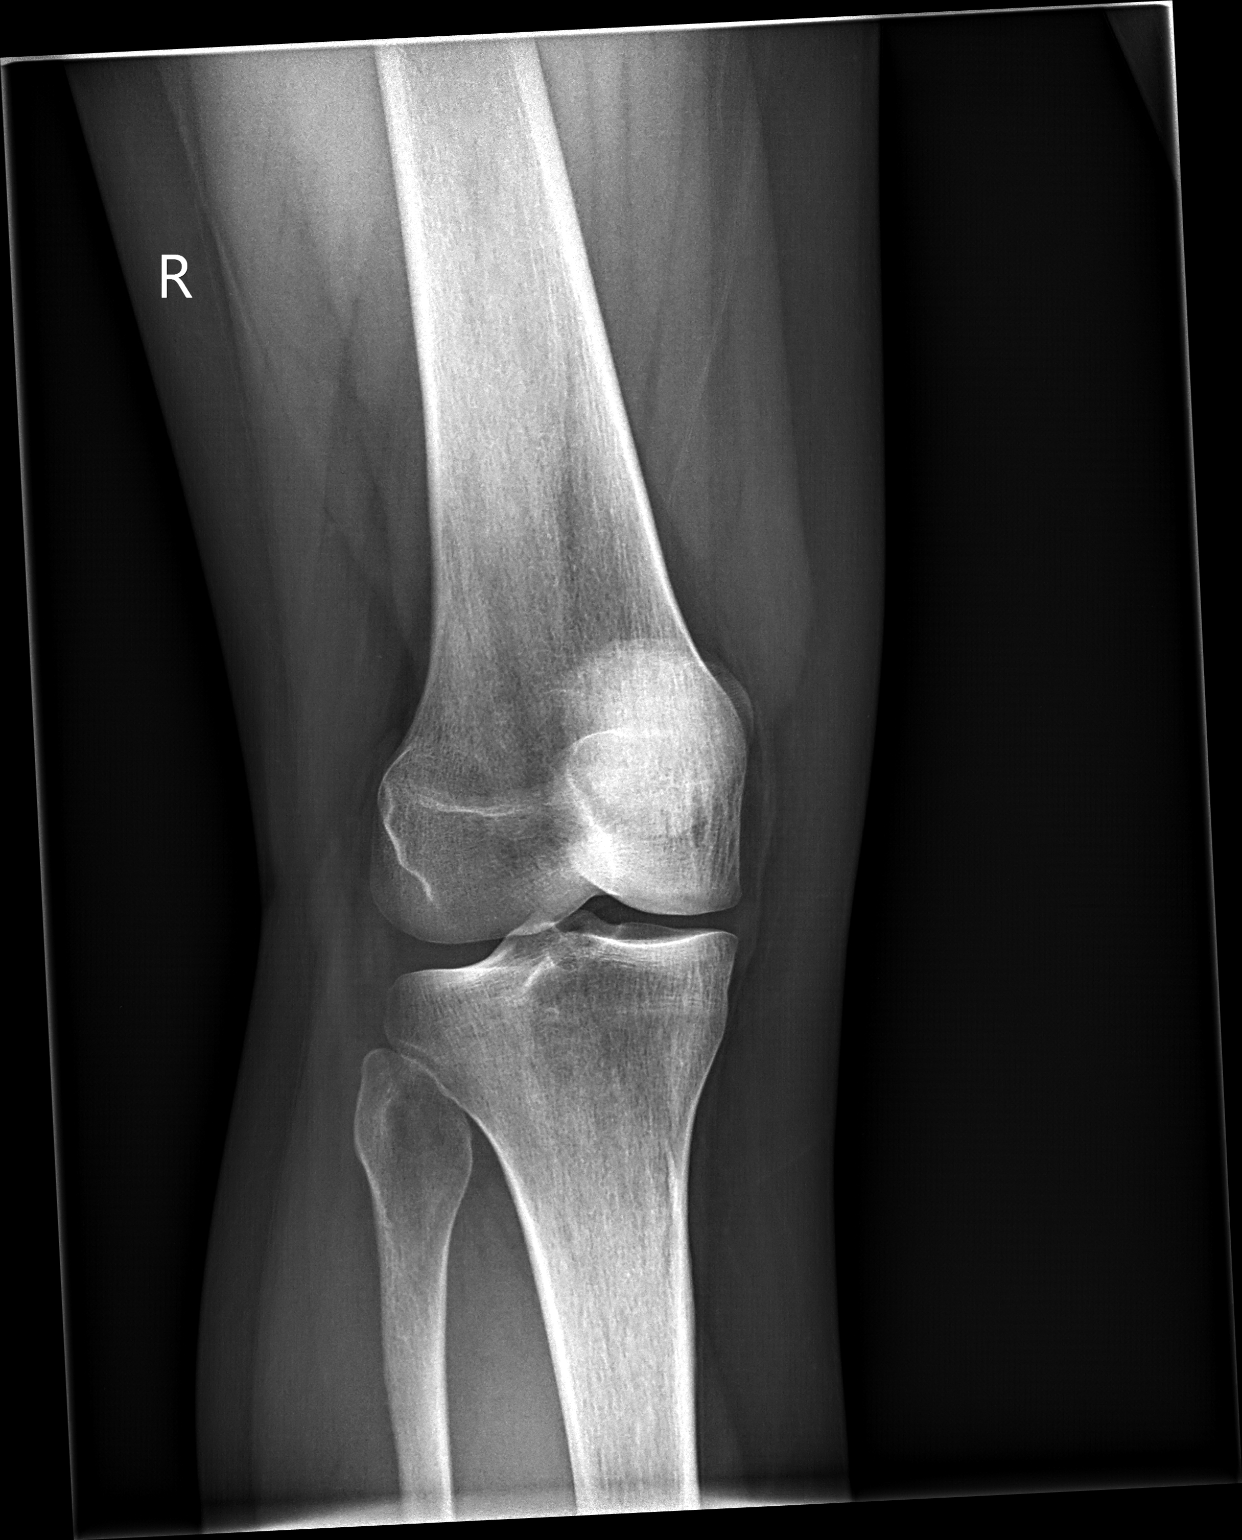

[knee lat]
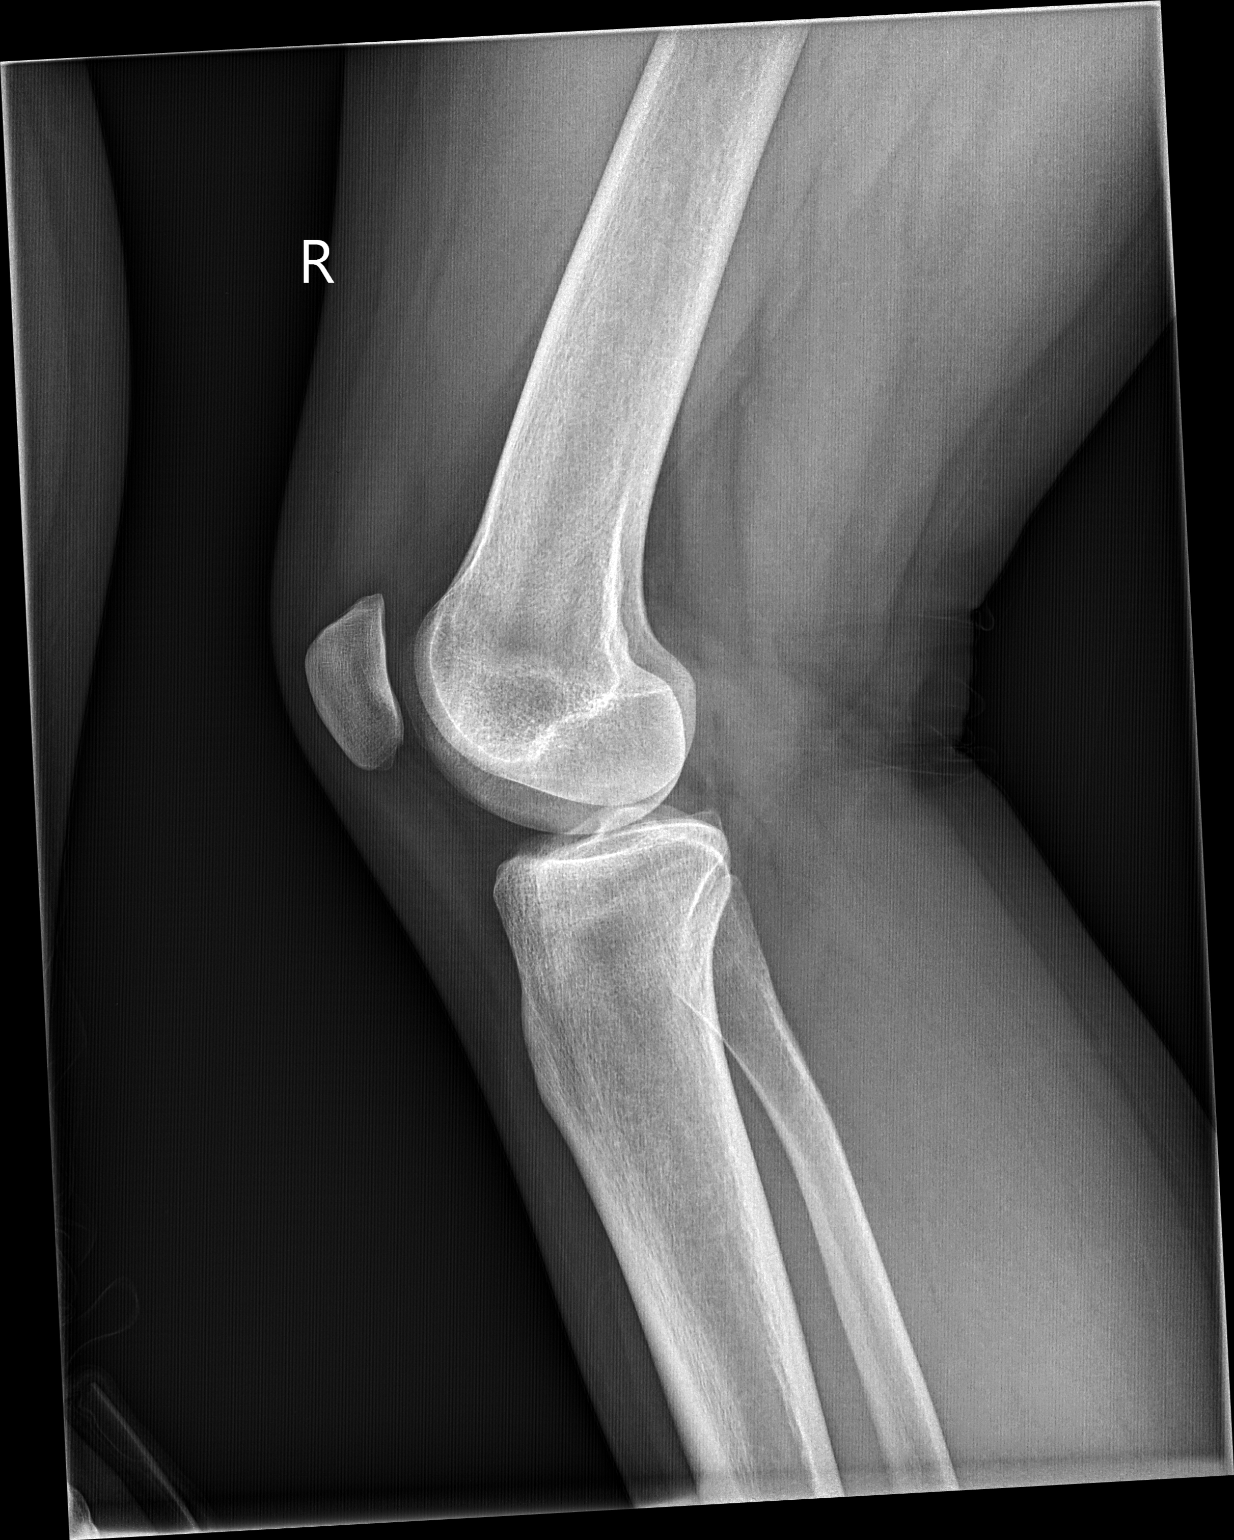

[3 of 3 positions shown; findings below may reference images not displayed]

FINDINGS: No definite acute fracture, dislocation, or osseous destruction is visualized.  Patella appears
grossly intact.  Bone mineralization is normal.  Joint spaces are well preserved.  No significant
degenerative changes evident.  No definite significant suprapatellar joint effusion evident.
IMPRESSION: No visualized fracture or definite acute osseous findings.

## 2021-12-05 IMAGING — DX XR chest 1V portable
1 series · 1 of 1 positions shown · non-contrast
Comparison: None

Procedure(s): XR chest 1V portable
PROCEDURE:
XR CHEST 1-V ,
HISTORY: Chest wall pain.
TECHNIQUE: One view of the chest is submitted.

[chest ap]
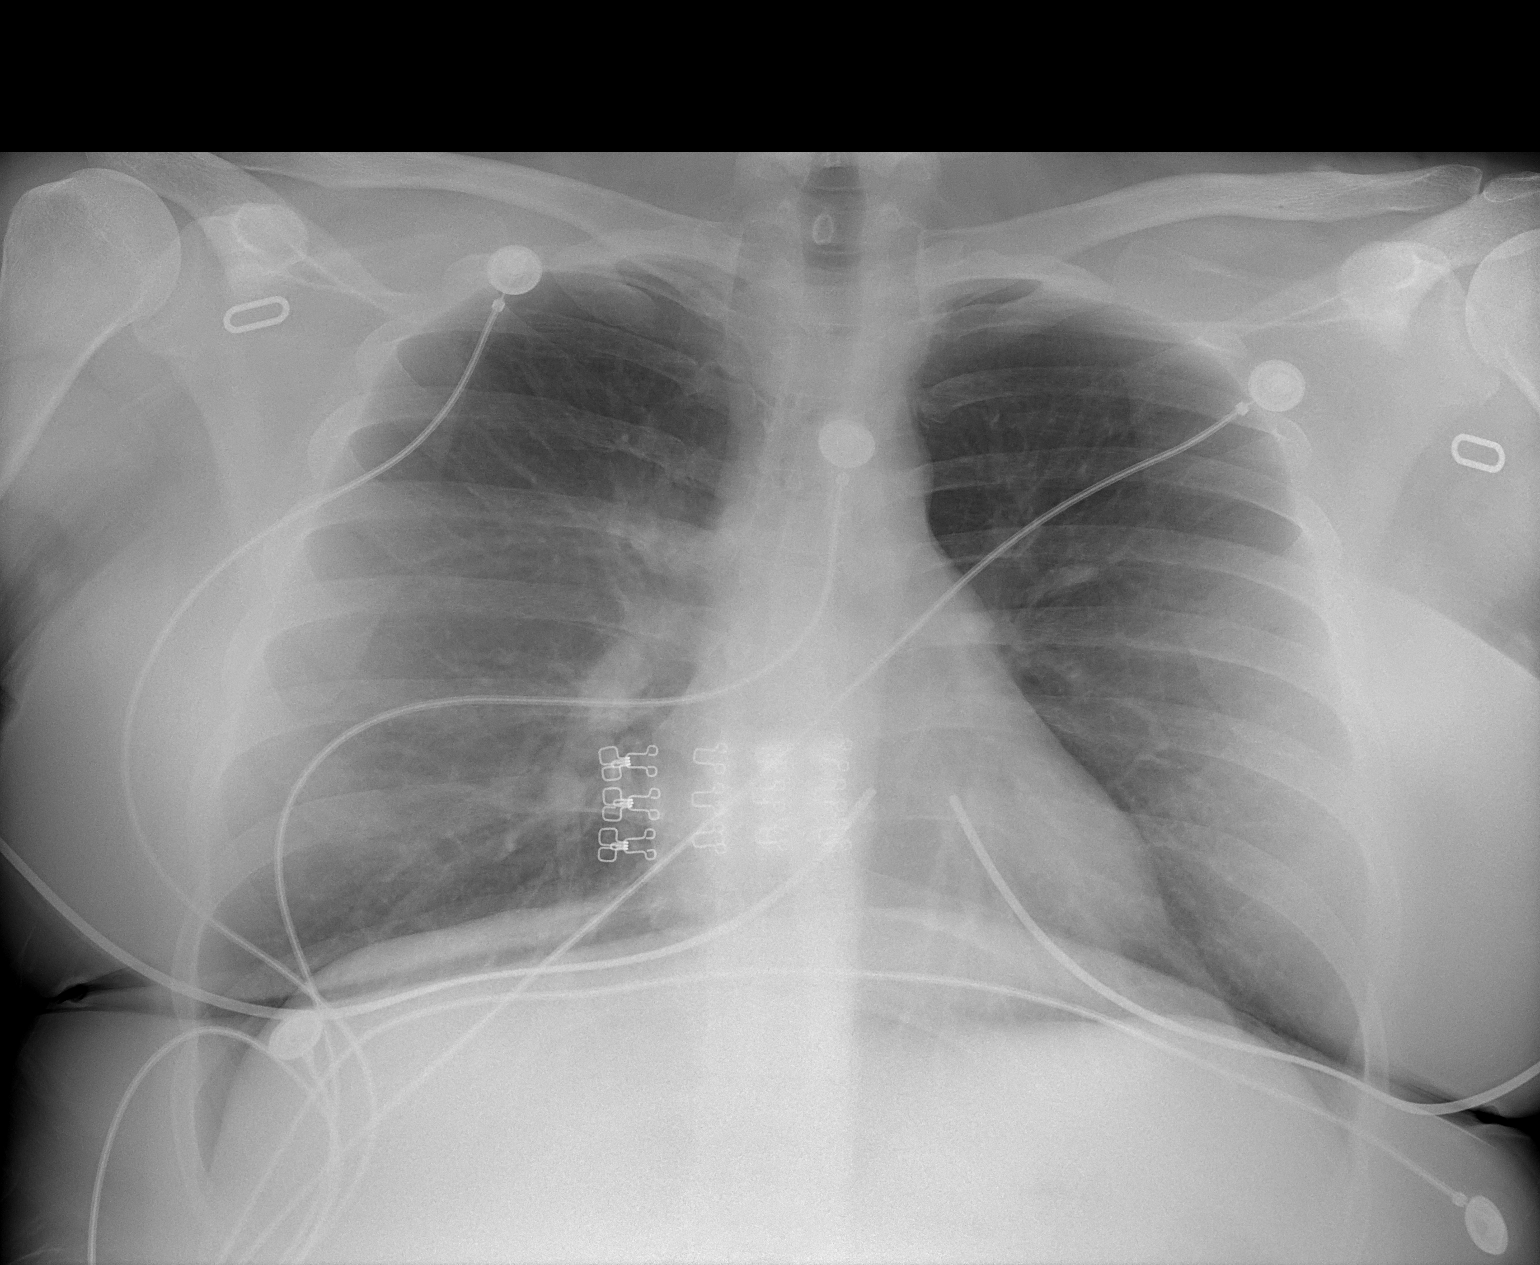

[1 of 1 positions shown; findings below may reference images not displayed]

FINDINGS: Heart: Heart size normal.
Lungs: No airspace opacities.  No pneumothorax.  No pleural effusion .
Mediastinum: Normal
Bones: Osseous structures are intact
Visualized upper abdomen: Normal
IMPRESSION: No acute cardiopulmonary findings

## 2022-03-13 IMAGING — DX XR ankle complete min 3V RT
3 series · 3 of 3 positions shown · non-contrast
Comparison: Not available

Procedure(s): XR ankle complete min 3V RT
PROCEDURE:
XR ANKLE JT 3-V Right,
HISTORY: Right ankle pain and swelling
TECHNIQUE: 3 views of the right ankle.

[ankle ap]
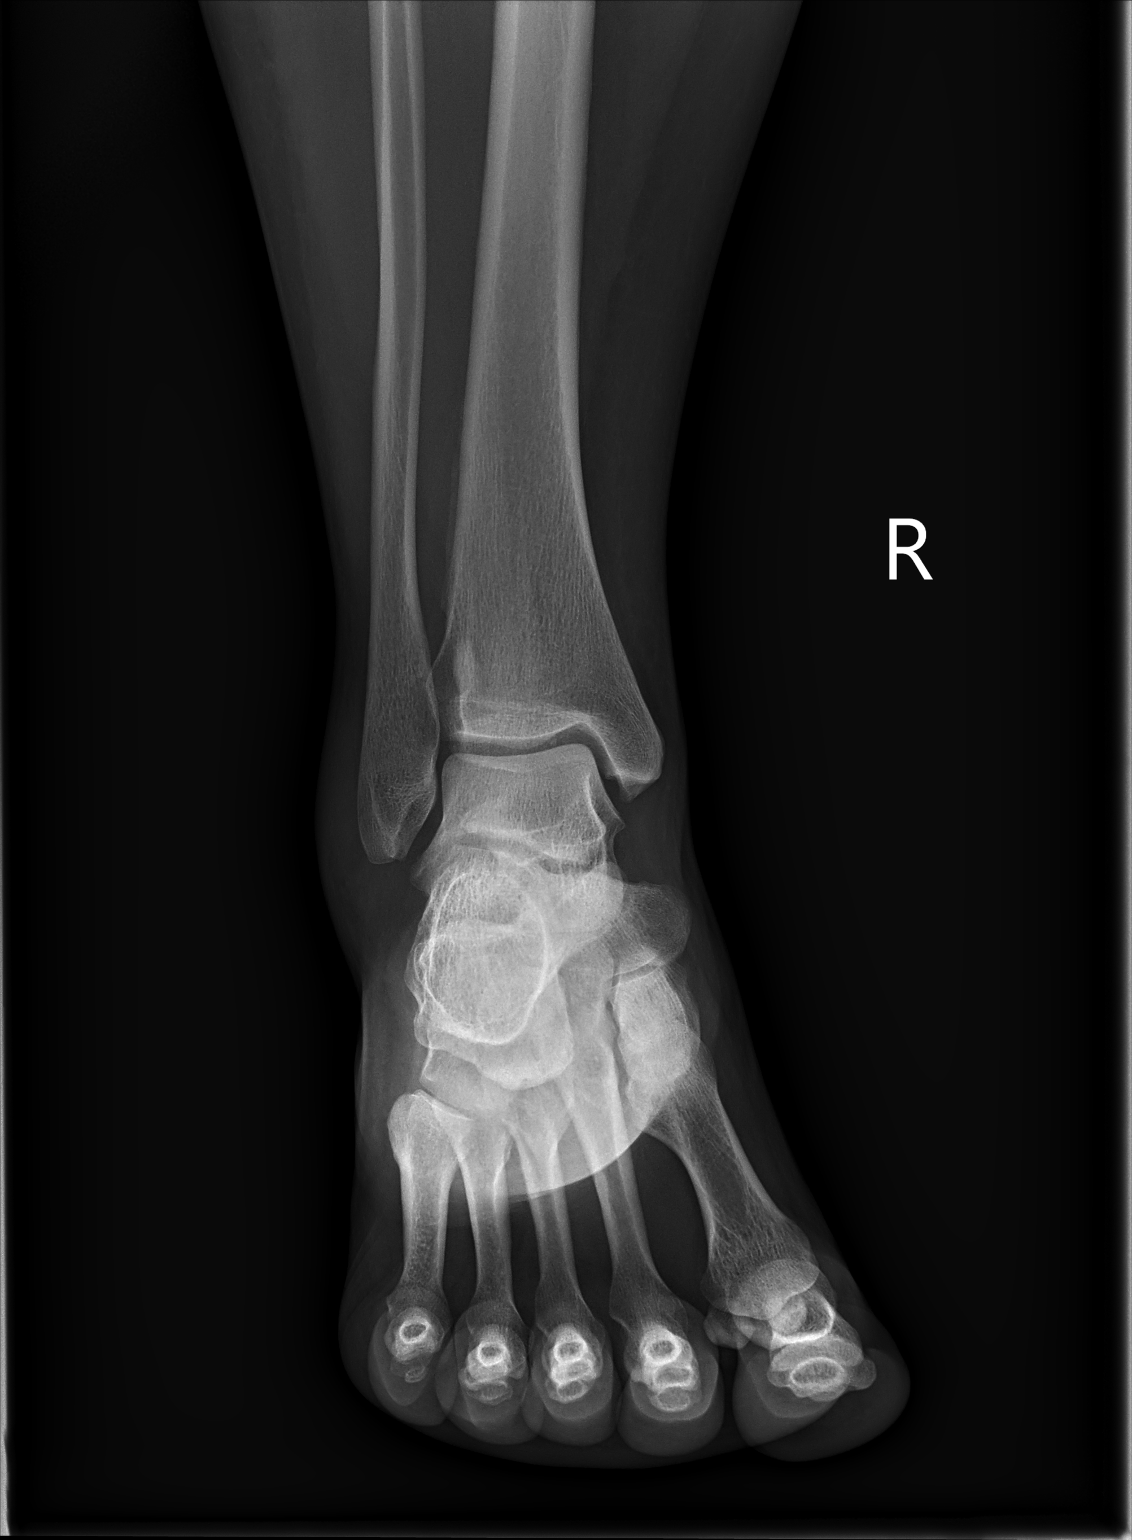

[ankle obl]
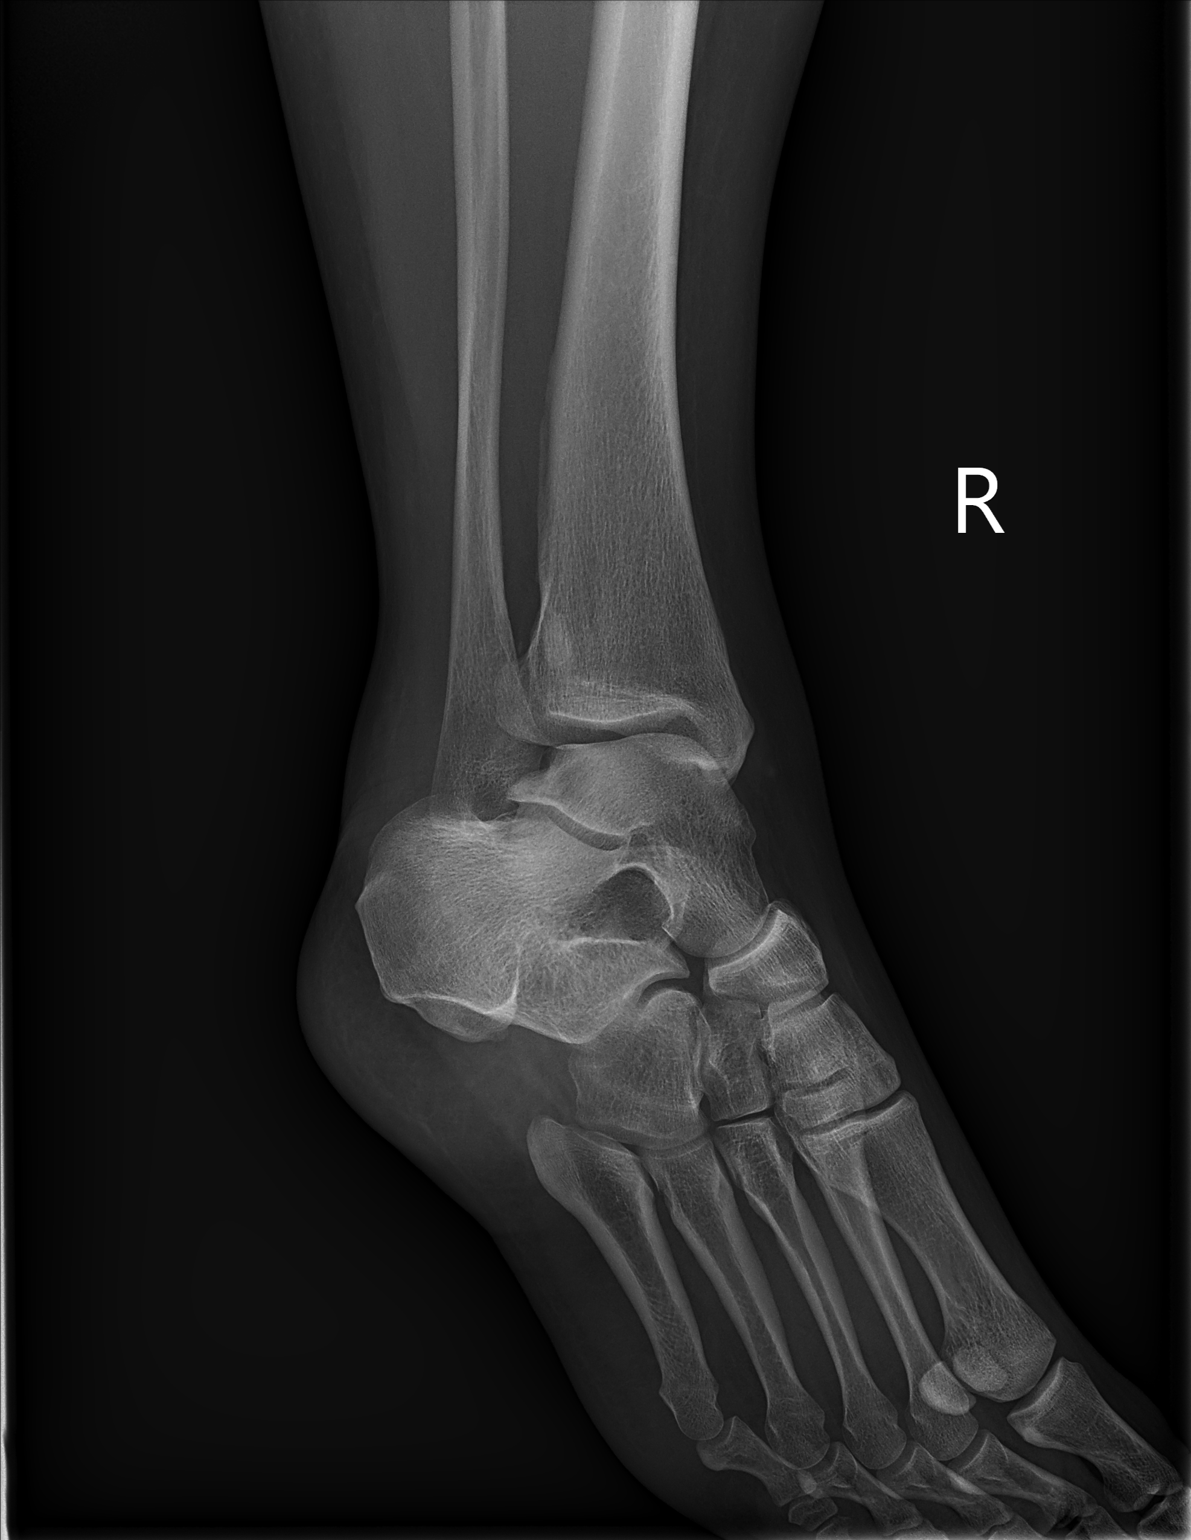

[ankle lat]
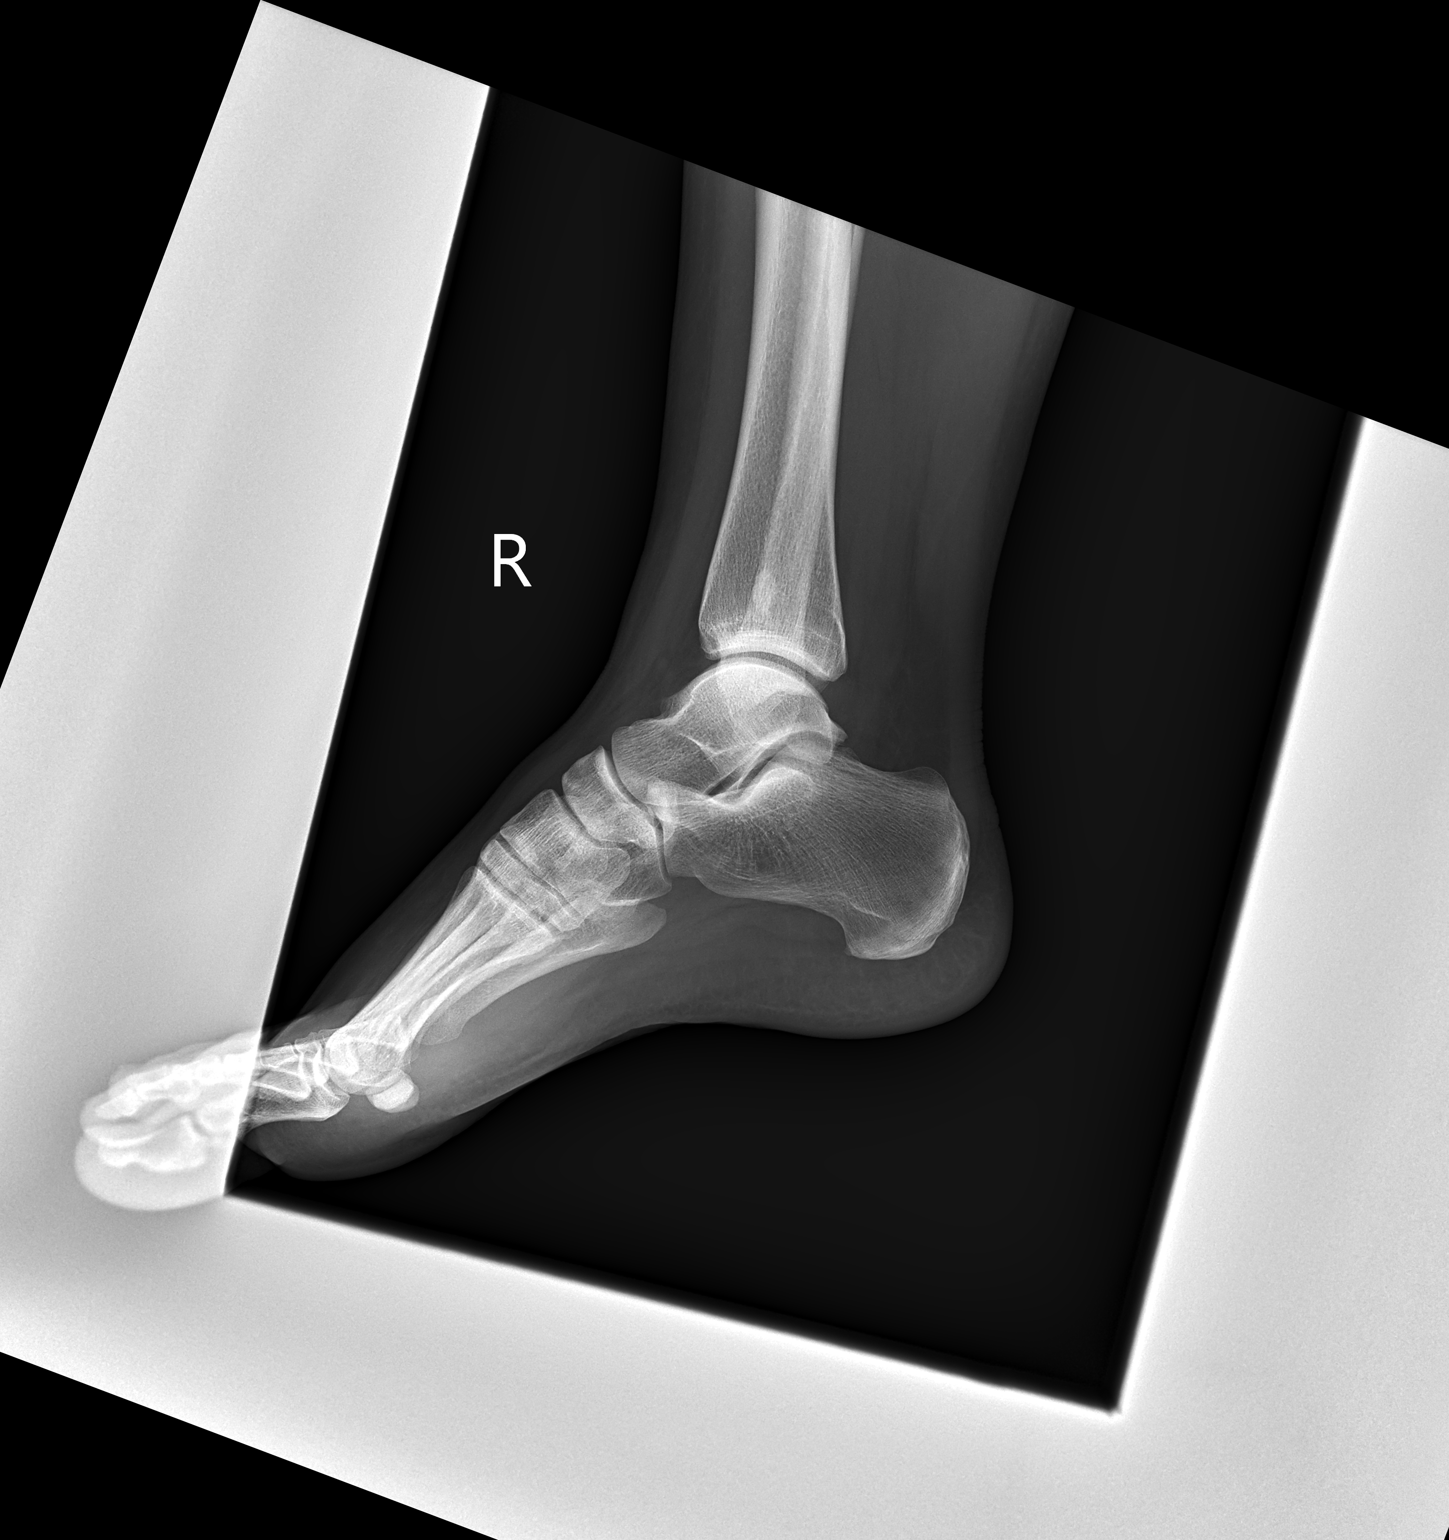

[3 of 3 positions shown; findings below may reference images not displayed]

FINDINGS: No definite acute fracture, dislocation, or osseous destruction is visualized.  Bone mineralization
appears normal.  The ankle mortise appears grossly unremarkable.  There is a small ovoid sclerotic
focus noted within the lateral right distal tibial metaphysis measuring approximately 1 x 0.5 cm
which probably represents a bone island.  No definite significant degenerative changes seen.  There
is mild to moderate right lateral ankle soft tissue swelling seen overlying the lateral malleolus.
IMPRESSION: No visualized fracture or definite acute osseous findings.
Right lateral ankle soft tissue swelling.
